# Patient Record
Sex: Female | Born: 1985 | Race: White | Hispanic: No | Marital: Single | Smoking: Current every day smoker
Health system: Southern US, Community
[De-identification: ages and names within clinical notes are randomized; demographics above are authoritative.]

## PROBLEM LIST (undated history)

## (undated) DIAGNOSIS — N200 Calculus of kidney: Secondary | ICD-10-CM

## (undated) HISTORY — PX: LITHOTRIPSY: SUR834

## (undated) HISTORY — PX: WISDOM TOOTH EXTRACTION: SHX21

---

## 2013-08-31 ENCOUNTER — Emergency Department (HOSPITAL_COMMUNITY): Payer: Medicaid Other

## 2013-08-31 ENCOUNTER — Encounter (HOSPITAL_COMMUNITY): Payer: Self-pay | Admitting: Emergency Medicine

## 2013-08-31 ENCOUNTER — Emergency Department (HOSPITAL_COMMUNITY)
Admission: EM | Admit: 2013-08-31 | Discharge: 2013-08-31 | Disposition: A | Discharge status: Home / Self Care | Payer: Medicaid Other | Attending: Emergency Medicine | Admitting: Emergency Medicine

## 2013-08-31 DIAGNOSIS — Z87442 Personal history of urinary calculi: Secondary | ICD-10-CM | POA: Diagnosis not present

## 2013-08-31 DIAGNOSIS — R109 Unspecified abdominal pain: Secondary | ICD-10-CM | POA: Diagnosis present

## 2013-08-31 DIAGNOSIS — M6281 Muscle weakness (generalized): Secondary | ICD-10-CM | POA: Diagnosis not present

## 2013-08-31 DIAGNOSIS — Z3202 Encounter for pregnancy test, result negative: Secondary | ICD-10-CM | POA: Diagnosis not present

## 2013-08-31 DIAGNOSIS — N23 Unspecified renal colic: Secondary | ICD-10-CM | POA: Diagnosis not present

## 2013-08-31 HISTORY — DX: Calculus of kidney: N20.0

## 2013-08-31 LAB — URINALYSIS, ROUTINE W REFLEX MICROSCOPIC
Glucose, UA: NEGATIVE mg/dL
Ketones, ur: NEGATIVE mg/dL
NITRITE: NEGATIVE
Protein, ur: 100 mg/dL — AB
SPECIFIC GRAVITY, URINE: 1.027 (ref 1.005–1.030)
UROBILINOGEN UA: 1 mg/dL (ref 0.0–1.0)
pH: 5.5 (ref 5.0–8.0)

## 2013-08-31 LAB — POC URINE PREG, ED: PREG TEST UR: NEGATIVE

## 2013-08-31 LAB — COMPREHENSIVE METABOLIC PANEL
ALK PHOS: 101 U/L (ref 39–117)
ALT: 39 U/L — ABNORMAL HIGH (ref 0–35)
ANION GAP: 17 — AB (ref 5–15)
AST: 24 U/L (ref 0–37)
Albumin: 4.6 g/dL (ref 3.5–5.2)
BUN: 13 mg/dL (ref 6–23)
CO2: 22 mEq/L (ref 19–32)
Calcium: 10.4 mg/dL (ref 8.4–10.5)
Chloride: 102 mEq/L (ref 96–112)
Creatinine, Ser: 0.88 mg/dL (ref 0.50–1.10)
GFR calc Af Amer: >90 mL/min (ref >90)
GFR calc non Af Amer: 87 mL/min — ABNORMAL LOW (ref >90)
Glucose, Bld: 97 mg/dL (ref 70–99)
POTASSIUM: 4.2 meq/L (ref 3.7–5.3)
SODIUM: 141 meq/L (ref 137–147)
TOTAL PROTEIN: 8.2 g/dL (ref 6.0–8.3)
Total Bilirubin: 0.3 mg/dL (ref 0.3–1.2)

## 2013-08-31 LAB — LIPASE, BLOOD: Lipase: 19 U/L (ref 11–59)

## 2013-08-31 LAB — CBC WITH DIFFERENTIAL/PLATELET
Basophils Absolute: 0.1 10*3/uL (ref 0.0–0.1)
Basophils Relative: 1 % (ref 0–1)
EOS ABS: 0.2 10*3/uL (ref 0.0–0.7)
Eosinophils Relative: 2 % (ref 0–5)
HCT: 44.6 % (ref 36.0–46.0)
Hemoglobin: 15 g/dL (ref 12.0–15.0)
Lymphocytes Relative: 23 % (ref 12–46)
Lymphs Abs: 2.4 10*3/uL (ref 0.7–4.0)
MCH: 29.6 pg (ref 26.0–34.0)
MCHC: 33.6 g/dL (ref 30.0–36.0)
MCV: 88 fL (ref 78.0–100.0)
MONOS PCT: 7 % (ref 3–12)
Monocytes Absolute: 0.8 10*3/uL (ref 0.1–1.0)
NEUTROS PCT: 67 % (ref 43–77)
Neutro Abs: 7 10*3/uL (ref 1.7–7.7)
PLATELETS: 324 10*3/uL (ref 150–400)
RBC: 5.07 MIL/uL (ref 3.87–5.11)
RDW: 14.2 % (ref 11.5–15.5)
WBC: 10.4 10*3/uL (ref 4.0–10.5)

## 2013-08-31 LAB — URINE MICROSCOPIC-ADD ON

## 2013-08-31 MED ORDER — OXYCODONE-ACETAMINOPHEN 5-325 MG PO TABS
2.0000 | ORAL_TABLET | Freq: Once | ORAL | Status: AC
  Administered 2013-08-31: 2 via ORAL
  Filled 2013-08-31: qty 2

## 2013-08-31 MED ORDER — IBUPROFEN 600 MG PO TABS: 600.0000 mg | ORAL_TABLET | Freq: Four times a day (QID) | ORAL | Status: DC | PRN

## 2013-08-31 MED ORDER — OXYCODONE-ACETAMINOPHEN 5-325 MG PO TABS: 1.0000 | ORAL_TABLET | Freq: Four times a day (QID) | ORAL | Status: DC | PRN

## 2013-08-31 MED ORDER — HYDROMORPHONE HCL PF 1 MG/ML IJ SOLN
1.0000 mg | INTRAMUSCULAR | Status: AC | PRN
  Administered 2013-08-31 (×2): 1 mg via INTRAVENOUS
  Filled 2013-08-31 (×2): qty 1

## 2013-08-31 MED ORDER — KETOROLAC TROMETHAMINE 30 MG/ML IJ SOLN
30.0000 mg | Freq: Once | INTRAMUSCULAR | Status: AC
  Administered 2013-08-31: 30 mg via INTRAVENOUS
  Filled 2013-08-31: qty 1

## 2013-08-31 MED ORDER — TAMSULOSIN HCL 0.4 MG PO CAPS: 0.4000 mg | ORAL_CAPSULE | Freq: Every day | ORAL | Status: DC

## 2013-08-31 MED ORDER — HYDROMORPHONE HCL PF 1 MG/ML IJ SOLN
1.0000 mg | Freq: Once | INTRAMUSCULAR | Status: AC
  Administered 2013-08-31: 1 mg via INTRAVENOUS
  Filled 2013-08-31: qty 1

## 2013-08-31 MED ORDER — ONDANSETRON HCL 4 MG/2ML IJ SOLN
4.0000 mg | Freq: Once | INTRAMUSCULAR | Status: AC
  Administered 2013-08-31: 4 mg via INTRAVENOUS
  Filled 2013-08-31: qty 2

## 2013-08-31 NOTE — ED Provider Notes (Signed)
CSN: [id number]     Arrival date & time 08/31/13  1435 History   First MD Initiated Contact with Patient 08/31/13 1458     Chief Complaint  Patient presents with  . Abdominal Pain     (Consider location/radiation/quality/duration/timing/severity/associated sxs/prior Treatment) HPI Comments: Patient with history of cesarean section, lithotripsy for previous kidney stones -- presents with complaint of left flank and abdominal pain that began at 12:30PM today.  The symptoms are associated with nausea and vomiting. Patient denies chest pain, shortness of breath, cough. She denies diarrhea, dysuria, hematuria. No treatments prior to arrival. She was treated previously for stones in [place]. The onset of this condition was acute. The course is constant. Aggravating factors: none. Alleviating factors: none.    Patient is a 28 y.o. female presenting with abdominal pain. The history is provided by the patient.  Abdominal Pain Associated symptoms: nausea and vomiting   Associated symptoms: no chest pain, no cough, no diarrhea, no dysuria, no fever, no hematuria, no sore throat, no vaginal bleeding and no vaginal discharge     Past Medical History  Diagnosis Date  . Kidney stones    History reviewed. No pertinent past surgical history. No family history on file. History  Substance Use Topics  . Smoking status: Not on file  . Smokeless tobacco: Not on file  . Alcohol Use: Not on file   OB History   Grav Para Term Preterm Abortions TAB SAB Ect Mult Living                 Review of Systems  Constitutional: Negative for fever.  HENT: Negative for rhinorrhea and sore throat.   Eyes: Negative for redness.  Respiratory: Negative for cough.   Cardiovascular: Negative for chest pain.  Gastrointestinal: Positive for nausea, vomiting and abdominal pain. Negative for diarrhea.  Genitourinary: Positive for flank pain. Negative for dysuria, hematuria, vaginal bleeding and vaginal discharge.   Musculoskeletal: Negative for myalgias.  Skin: Negative for rash.  Neurological: Positive for weakness. Negative for headaches.    Allergies  Review of patient's allergies indicates no known allergies.  Home Medications   Prior to Admission medications   Not on File   BP 108/81  Pulse 78  Temp(Src) 97.6 F (36.4 C) (Oral)  Resp 18  SpO2 95%  LMP 08/30/2013  Physical Exam  Nursing note and vitals reviewed. Constitutional: She appears well-developed and well-nourished. She appears distressed (tearful, rolling in bed).  HENT:  Head: Normocephalic and atraumatic.  Eyes: Conjunctivae are normal. Right eye exhibits no discharge. Left eye exhibits no discharge.  Neck: Normal range of motion. Neck supple.  Cardiovascular: Normal rate, regular rhythm and normal heart sounds.   No murmur heard. Pulmonary/Chest: Effort normal and breath sounds normal. No respiratory distress. She has no wheezes. She has no rales.  Abdominal: Soft. Bowel sounds are normal. She exhibits no distension. There is tenderness (left lateral and left lower, mild). There is no rebound and no guarding.  Neurological: She is alert.  Skin: Skin is warm and dry.  Psychiatric: She has a normal mood and affect.    ED Course  Procedures (including critical care time) Labs Review Labs Reviewed  COMPREHENSIVE METABOLIC PANEL - Abnormal; Notable for the following:    ALT 39 (*)    GFR calc non Af Amer 87 (*)    Anion gap 17 (*)    All other components within normal limits  URINALYSIS, ROUTINE W REFLEX MICROSCOPIC - Abnormal; Notable for the following:  Color, Urine RED (*)    APPearance TURBID (*)    Hgb urine dipstick LARGE (*)    Bilirubin Urine MODERATE (*)    Protein, ur 100 (*)    Leukocytes, UA SMALL (*)    All other components within normal limits  URINE MICROSCOPIC-ADD ON - Abnormal; Notable for the following:    Bacteria, UA FEW (*)    Crystals CA OXALATE CRYSTALS (*)    All other components  within normal limits  CBC WITH DIFFERENTIAL  LIPASE, BLOOD  POC URINE PREG, ED    Imaging Review Ct Renal Stone Study  08/31/2013   CLINICAL DATA:  Left flank pain.  Nausea and vomiting.  EXAM: CT RENAL STONE PROTOCOL  TECHNIQUE: Multidetector CT imaging of the abdomen and pelvis was performed following the standard protocol without intravenous contrast  COMPARISON:  None.  FINDINGS: The lung bases are clear.  No pleural or pericardial effusion.  The patient has moderate left hydronephrosis due to a 0.7 cm distal left ureteral stone. The stone is approximately 5 cm proximal to the UVJ. A 0.4 cm nonobstructing stone is identified in the midpole of the left kidney. Also seen is a punctate nonobstructing stone in the lower pole of the right kidney. The kidneys are otherwise unremarkable.  The liver is low attenuating consistent with fatty infiltration. No focal liver lesion is seen. The gallbladder, adrenal glands, spleen and pancreas appear normal. Uterus, adnexa and urinary bladder are unremarkable. The stomach, small and large bowel and appendix appear normal. No lymphadenopathy or fluid is seen. There is no focal bony abnormality.  IMPRESSION: Moderate left hydronephrosis due to a 0.7 cm distal left ureteral stone.  0.4 cm nonobstructing stone left kidney. Punctate nonobstructing stone right kidney also seen.  Fatty infiltration of the liver.   Electronically Signed   By: Drusilla Kanner M.D.   On: 08/31/2013 17:10     EKG Interpretation   Date/Time:  Saturday August 31 2013 14:49:15 EDT Ventricular Rate:  70 PR Interval:  151 QRS Duration: 90 QT Interval:  432 QTC Calculation: 466 R Axis:   66 Text Interpretation:  Age not entered, assumed to be  28 years old for  purpose of ECG interpretation Sinus rhythm Baseline wander in lead(s) V6  No old tracing to compare Confirmed by Ethelda Chick  MD, SAM [phone]) on  08/31/2013 3:52:51 PM      3:05 PM Patient seen and examined. Work-up initiated.  CT ordered -- no priors here and pain described pain as being worse than she has ever had. Medications ordered.   Vital signs reviewed and are as follows: BP 108/81  Pulse 78  Temp(Src) 97.6 F (36.4 C) (Oral)  Resp 18  SpO2 95%  LMP 08/30/2013  5:28 PM Pt d/w and seen by Dr. Ethelda Chick. Patient has 7mm distal L stone.   5:34 PM Reviewed findings with Dr. Ethelda Chick. Will give toradol.   6:06 PM Pain is better controlled. Will attempt transition to PO percocet. Pt informed of findings.   7:13 PM patient continues to do well and is requesting discharge home.  Patient counseled on kidney stone treatment. Urged patient to strain urine and save any stones. Urged urology follow-up and return to [facility] with any complications. Counseled patient to maintain good fluid intake.   Counseled patient on use of Flomax.   Patient counseled on use of narcotic pain medications. Counseled not to combine these medications with others containing tylenol. Urged not to drink alcohol, drive, or perform  any other activities that requires focus while taking these medications. The patient verbalizes understanding and agrees with the plan.   MDM   Final diagnoses:  Ureteral colic   Patient with left ureteral colic from 7 mm stone demonstrated in the distal ureter. Symptoms are controlled in emergency department patient is much more comfortable. She is appropriate for discharge to home with urology followup. Referrals given. Pain medicine and Flomax given for home. Return precautions discussed with patient.    Renne CriglerJoshua Shacola Schussler, PA-C 08/31/13 1914

## 2013-08-31 NOTE — ED Provider Notes (Signed)
Plains of left flank pain radiating to left groin onset 12:30 PM today. Pain feels like kidney stone she's had in the past. No treatment prior to coming here nothing makes symptoms better or worse. Feels somewhat improved since treatment in the emergency department on exam appears uncomfortable. Rocking back and forth on the bed. Alert Glasgow Coma Score 15  Connie SouSam Kadee Philyaw, MD 08/31/13 2211

## 2013-08-31 NOTE — ED Notes (Signed)
Bed: WU98 Expected date:  Expected time:  Means of arrival:  Comments: EMS- flank and abdominal pain

## 2013-08-31 NOTE — ED Notes (Signed)
PA at bedside.

## 2013-08-31 NOTE — Discharge Instructions (Signed)
Please read and follow all provided instructions.  Your diagnoses today include:  1. Ureteral colic     Tests performed today include:  Urine test that showed blood in your urine and no infection  CT scan which showed a 7 millimeter kidney on the left side  Blood test that showed normal kidney function  Vital signs. See below for your results today.   Medications prescribed:   Percocet (oxycodone/acetaminophen) - narcotic pain medication  DO NOT drive or perform any activities that require you to be awake and alert because this medicine can make you drowsy. BE VERY CAREFUL not to take multiple medicines containing Tylenol (also called acetaminophen). Doing so can lead to an overdose which can damage your liver and cause liver failure and possibly death.   Flomax (tamsulosin) - relaxes smooth muscle to help kidney stones pass   Ibuprofen (Motrin, Advil) - anti-inflammatory pain medication  Do not exceed 600mg  ibuprofen every 6 hours, take with food  You have been prescribed an anti-inflammatory medication or NSAID. Take with food. Take smallest effective dose for the shortest duration needed for your pain. Stop taking if you experience stomach pain or vomiting.   Take any prescribed medications only as directed.  Home care instructions:  Follow any educational materials contained in this packet.  Please double your fluid intake for the next several days. Strain your urine and save any stones that may pass.   Follow-up instructions: Please follow-up with your urologist or the urologist referral (provided on front page) in the next 1 week for further evaluation of your symptoms.  If you need to return to the Emergency Department, go to [facility] and not [facility]. The urologists are located at [facility] and can better care for you at this location.  Return instructions:  If you need to return to the Emergency Department, go to [facility] and  not [facility]. The [facility]rologists are located at [facility] and can better care for you at this location.   Please return to the Emergency Department if you experience worsening symptoms.  Please return if you develop fever or uncontrolled pain or vomiting.  Please return if you have any other emergent concerns.  Additional Information:  Your vital signs today were: BP 95/64   Pulse 74   Temp(Src) 97.6 F (36.4 C) (Oral)   Resp 18   SpO2 99%   LMP 08/30/2013 If your blood pressure (BP) was elevated above 135/85 this visit, please have this repeated by your doctor within one month. --------------

## 2013-08-31 NOTE — ED Notes (Signed)
Pt sts LMP started Thursday.  Was wearing a large tampon today and thought that might have been why the pain started today.  Pain rad. from back around to Left femoral area.  Pt concerned about pregnancy.  Pt reports feels like past kidney stones. No trouble urinating but sts has been urinating more frequently than usual.

## 2013-08-31 NOTE — ED Notes (Signed)
Patient with left flank pain that radiates around to her abdominal area.  Denies pain with urination, is having nausea and vomiting.  History of kidney stones.

## 2013-08-31 NOTE — ED Provider Notes (Signed)
Medical screening examination/treatment/procedure(s) were conducted as a shared visit with non-physician practitioner(s) and myself.  I personally evaluated the patient during the encounter.   EKG Interpretation   Date/Time:  Saturday August 31 2013 14:49:15 EDT Ventricular Rate:  70 PR Interval:  151 QRS Duration: 90 QT Interval:  432 QTC Calculation: 466 R Axis:   66 Text Interpretation:  Age not entered, assumed to be  28 years old for  purpose of ECG interpretation Sinus rhythm Baseline wander in lead(s) V6  No old tracing to compare Confirmed by Ethelda ChickJACUBOWITZ  MD, Alese Furniss [phone]) on  08/31/2013 3:52:51 PM       Doug SouSam Malillany Kazlauskas, MD 08/31/13 2212

## 2013-08-31 NOTE — ED Notes (Signed)
Patient will give urine sample when she starts to feel pain medication.

## 2013-09-02 ENCOUNTER — Observation Stay (HOSPITAL_COMMUNITY)
Admission: EM | Admit: 2013-09-02 | Discharge: 2013-09-03 | Disposition: A | Discharge status: Home / Self Care | Payer: Medicaid Other | Attending: Urology | Admitting: Urology

## 2013-09-02 ENCOUNTER — Inpatient Hospital Stay (HOSPITAL_COMMUNITY): Payer: Medicaid Other | Admitting: Certified Registered Nurse Anesthetist

## 2013-09-02 ENCOUNTER — Other Ambulatory Visit: Payer: Self-pay | Admitting: Urology

## 2013-09-02 ENCOUNTER — Emergency Department (HOSPITAL_COMMUNITY): Payer: Medicaid Other

## 2013-09-02 ENCOUNTER — Encounter (HOSPITAL_COMMUNITY): Payer: Medicaid Other | Admitting: Certified Registered Nurse Anesthetist

## 2013-09-02 ENCOUNTER — Encounter (HOSPITAL_COMMUNITY)
Admission: EM | Disposition: A | Discharge status: Home / Self Care | Payer: Self-pay | Source: Home / Self Care | Attending: Urology

## 2013-09-02 ENCOUNTER — Encounter (HOSPITAL_COMMUNITY): Payer: Self-pay | Admitting: Emergency Medicine

## 2013-09-02 DIAGNOSIS — Z79899 Other long term (current) drug therapy: Secondary | ICD-10-CM | POA: Insufficient documentation

## 2013-09-02 DIAGNOSIS — N201 Calculus of ureter: Secondary | ICD-10-CM | POA: Diagnosis not present

## 2013-09-02 DIAGNOSIS — N200 Calculus of kidney: Secondary | ICD-10-CM | POA: Diagnosis present

## 2013-09-02 DIAGNOSIS — F17200 Nicotine dependence, unspecified, uncomplicated: Secondary | ICD-10-CM | POA: Insufficient documentation

## 2013-09-02 HISTORY — PX: CYSTOSCOPY/RETROGRADE/URETEROSCOPY: SHX5316

## 2013-09-02 LAB — COMPREHENSIVE METABOLIC PANEL
ALK PHOS: 87 U/L (ref 39–117)
ALT: 27 U/L (ref 0–35)
ANION GAP: 10 (ref 5–15)
AST: 18 U/L (ref 0–37)
Albumin: 4 g/dL (ref 3.5–5.2)
BUN: 13 mg/dL (ref 6–23)
CO2: 28 mEq/L (ref 19–32)
Calcium: 8.8 mg/dL (ref 8.4–10.5)
Chloride: 103 mEq/L (ref 96–112)
Creatinine, Ser: 0.89 mg/dL (ref 0.50–1.10)
GFR calc non Af Amer: 86 mL/min — ABNORMAL LOW (ref >90)
GLUCOSE: 85 mg/dL (ref 70–99)
POTASSIUM: 4 meq/L (ref 3.7–5.3)
Sodium: 141 mEq/L (ref 137–147)
Total Bilirubin: 0.4 mg/dL (ref 0.3–1.2)
Total Protein: 7 g/dL (ref 6.0–8.3)

## 2013-09-02 LAB — BASIC METABOLIC PANEL
Anion gap: 13 (ref 5–15)
BUN: 16 mg/dL (ref 6–23)
CALCIUM: 8.7 mg/dL (ref 8.4–10.5)
CHLORIDE: 103 meq/L (ref 96–112)
CO2: 24 meq/L (ref 19–32)
CREATININE: 0.95 mg/dL (ref 0.50–1.10)
GFR calc Af Amer: >90 mL/min (ref >90)
GFR calc non Af Amer: 80 mL/min — ABNORMAL LOW (ref >90)
GLUCOSE: 96 mg/dL (ref 70–99)
Potassium: 4 mEq/L (ref 3.7–5.3)
Sodium: 140 mEq/L (ref 137–147)

## 2013-09-02 LAB — URINALYSIS, ROUTINE W REFLEX MICROSCOPIC
BILIRUBIN URINE: NEGATIVE
Bilirubin Urine: NEGATIVE
GLUCOSE, UA: NEGATIVE mg/dL
Glucose, UA: NEGATIVE mg/dL
Ketones, ur: NEGATIVE mg/dL
Ketones, ur: NEGATIVE mg/dL
NITRITE: NEGATIVE
Nitrite: NEGATIVE
Protein, ur: NEGATIVE mg/dL
Protein, ur: NEGATIVE mg/dL
SPECIFIC GRAVITY, URINE: 1.017 (ref 1.005–1.030)
Specific Gravity, Urine: 1.016 (ref 1.005–1.030)
UROBILINOGEN UA: 0.2 mg/dL (ref 0.0–1.0)
Urobilinogen, UA: 0.2 mg/dL (ref 0.0–1.0)
pH: 5.5 (ref 5.0–8.0)
pH: 5.5 (ref 5.0–8.0)

## 2013-09-02 LAB — URINE MICROSCOPIC-ADD ON

## 2013-09-02 LAB — PREGNANCY, URINE: PREG TEST UR: NEGATIVE

## 2013-09-02 LAB — CBC WITH DIFFERENTIAL/PLATELET
Basophils Absolute: 0 10*3/uL (ref 0.0–0.1)
Basophils Relative: 1 % (ref 0–1)
EOS PCT: 5 % (ref 0–5)
Eosinophils Absolute: 0.4 10*3/uL (ref 0.0–0.7)
HEMATOCRIT: 39.4 % (ref 36.0–46.0)
Hemoglobin: 13 g/dL (ref 12.0–15.0)
LYMPHS PCT: 15 % (ref 12–46)
Lymphs Abs: 1.2 10*3/uL (ref 0.7–4.0)
MCH: 29.4 pg (ref 26.0–34.0)
MCHC: 33 g/dL (ref 30.0–36.0)
MCV: 89.1 fL (ref 78.0–100.0)
MONO ABS: 0.7 10*3/uL (ref 0.1–1.0)
Monocytes Relative: 8 % (ref 3–12)
NEUTROS ABS: 6 10*3/uL (ref 1.7–7.7)
Neutrophils Relative %: 73 % (ref 43–77)
Platelets: 272 10*3/uL (ref 150–400)
RBC: 4.42 MIL/uL (ref 3.87–5.11)
RDW: 14.3 % (ref 11.5–15.5)
WBC: 8.3 10*3/uL (ref 4.0–10.5)

## 2013-09-02 LAB — CBC
HEMATOCRIT: 40.2 % (ref 36.0–46.0)
HEMOGLOBIN: 12.8 g/dL (ref 12.0–15.0)
MCH: 29.2 pg (ref 26.0–34.0)
MCHC: 31.8 g/dL (ref 30.0–36.0)
MCV: 91.6 fL (ref 78.0–100.0)
Platelets: 266 10*3/uL (ref 150–400)
RBC: 4.39 MIL/uL (ref 3.87–5.11)
RDW: 14.4 % (ref 11.5–15.5)
WBC: 8.8 10*3/uL (ref 4.0–10.5)

## 2013-09-02 LAB — MRSA PCR SCREENING: MRSA BY PCR: NEGATIVE

## 2013-09-02 SURGERY — CYSTOSCOPY/RETROGRADE/URETEROSCOPY
Anesthesia: General | Site: Ureter | Laterality: Left

## 2013-09-02 MED ORDER — MIDAZOLAM HCL 5 MG/5ML IJ SOLN
INTRAMUSCULAR | Status: DC | PRN
  Administered 2013-09-02: 1 mg via INTRAVENOUS

## 2013-09-02 MED ORDER — IOHEXOL 300 MG/ML  SOLN
INTRAMUSCULAR | Status: DC | PRN
  Administered 2013-09-02: 8 mL via INTRAVENOUS

## 2013-09-02 MED ORDER — KETOROLAC TROMETHAMINE 30 MG/ML IJ SOLN
INTRAMUSCULAR | Status: DC | PRN
  Administered 2013-09-02: 30 mg via INTRAVENOUS

## 2013-09-02 MED ORDER — SENNOSIDES-DOCUSATE SODIUM 8.6-50 MG PO TABS
2.0000 | ORAL_TABLET | Freq: Every day | ORAL | Status: DC
  Administered 2013-09-02: 2 via ORAL
  Filled 2013-09-02 (×2): qty 2

## 2013-09-02 MED ORDER — FENTANYL CITRATE 0.05 MG/ML IJ SOLN
INTRAMUSCULAR | Status: AC
  Filled 2013-09-02: qty 2

## 2013-09-02 MED ORDER — PROPOFOL 10 MG/ML IV BOLUS
INTRAVENOUS | Status: DC | PRN
  Administered 2013-09-02: 200 mg via INTRAVENOUS

## 2013-09-02 MED ORDER — OXYCODONE-ACETAMINOPHEN 5-325 MG PO TABS
1.0000 | ORAL_TABLET | ORAL | Status: DC | PRN
  Administered 2013-09-02 – 2013-09-03 (×2): 2 via ORAL
  Filled 2013-09-02 (×2): qty 2

## 2013-09-02 MED ORDER — DEXTROSE 5 % IV SOLN
3.0000 g | INTRAVENOUS | Status: AC
  Administered 2013-09-02: 3 g via INTRAVENOUS
  Filled 2013-09-02 (×2): qty 3000

## 2013-09-02 MED ORDER — SODIUM CHLORIDE 0.45 % IV SOLN
INTRAVENOUS | Status: DC
  Administered 2013-09-02: 22:00:00 via INTRAVENOUS

## 2013-09-02 MED ORDER — HYDROMORPHONE HCL PF 1 MG/ML IJ SOLN: 0.5000 mg | INTRAMUSCULAR | Status: DC | PRN

## 2013-09-02 MED ORDER — MEPERIDINE HCL 25 MG/ML IJ SOLN: 6.2500 mg | INTRAMUSCULAR | Status: DC | PRN

## 2013-09-02 MED ORDER — DEXAMETHASONE SODIUM PHOSPHATE 10 MG/ML IJ SOLN
INTRAMUSCULAR | Status: AC
  Filled 2013-09-02: qty 1

## 2013-09-02 MED ORDER — DEXAMETHASONE SODIUM PHOSPHATE 10 MG/ML IJ SOLN
INTRAMUSCULAR | Status: DC | PRN
  Administered 2013-09-02: 10 mg via INTRAVENOUS

## 2013-09-02 MED ORDER — LACTATED RINGERS IV SOLN: INTRAVENOUS | Status: DC

## 2013-09-02 MED ORDER — LACTATED RINGERS IV SOLN
INTRAVENOUS | Status: DC | PRN
  Administered 2013-09-02 (×2): via INTRAVENOUS

## 2013-09-02 MED ORDER — LIDOCAINE HCL (CARDIAC) 20 MG/ML IV SOLN
INTRAVENOUS | Status: AC
  Filled 2013-09-02: qty 5

## 2013-09-02 MED ORDER — FENTANYL CITRATE 0.05 MG/ML IJ SOLN: 25.0000 ug | INTRAMUSCULAR | Status: DC | PRN

## 2013-09-02 MED ORDER — DIPHENHYDRAMINE HCL 12.5 MG/5ML PO ELIX: 12.5000 mg | ORAL_SOLUTION | Freq: Four times a day (QID) | ORAL | Status: DC | PRN

## 2013-09-02 MED ORDER — DIPHENHYDRAMINE HCL 50 MG/ML IJ SOLN: 12.5000 mg | Freq: Four times a day (QID) | INTRAMUSCULAR | Status: DC | PRN

## 2013-09-02 MED ORDER — ONDANSETRON HCL 4 MG/2ML IJ SOLN
4.0000 mg | Freq: Once | INTRAMUSCULAR | Status: AC
  Administered 2013-09-02: 4 mg via INTRAVENOUS
  Filled 2013-09-02: qty 2

## 2013-09-02 MED ORDER — SODIUM CHLORIDE 0.9 % IV BOLUS (SEPSIS)
1000.0000 mL | Freq: Once | INTRAVENOUS | Status: AC
  Administered 2013-09-02: 1000 mL via INTRAVENOUS

## 2013-09-02 MED ORDER — FENTANYL CITRATE 0.05 MG/ML IJ SOLN
INTRAMUSCULAR | Status: DC | PRN
  Administered 2013-09-02 (×3): 50 ug via INTRAVENOUS

## 2013-09-02 MED ORDER — PROMETHAZINE HCL 25 MG/ML IJ SOLN: 6.2500 mg | INTRAMUSCULAR | Status: DC | PRN

## 2013-09-02 MED ORDER — SODIUM CHLORIDE 0.9 % IV SOLN
Freq: Once | INTRAVENOUS | Status: AC
  Administered 2013-09-02: 11:00:00 via INTRAVENOUS

## 2013-09-02 MED ORDER — MIDAZOLAM HCL 2 MG/2ML IJ SOLN
INTRAMUSCULAR | Status: AC
  Filled 2013-09-02: qty 2

## 2013-09-02 MED ORDER — PROPOFOL 10 MG/ML IV BOLUS
INTRAVENOUS | Status: AC
  Filled 2013-09-02: qty 20

## 2013-09-02 MED ORDER — HYDROMORPHONE HCL PF 1 MG/ML IJ SOLN
1.0000 mg | INTRAMUSCULAR | Status: AC | PRN
  Administered 2013-09-02 (×2): 1 mg via INTRAVENOUS
  Filled 2013-09-02 (×2): qty 1

## 2013-09-02 MED ORDER — LIDOCAINE HCL (CARDIAC) 20 MG/ML IV SOLN
INTRAVENOUS | Status: DC | PRN
  Administered 2013-09-02: 75 mg via INTRAVENOUS

## 2013-09-02 MED ORDER — ONDANSETRON HCL 4 MG/2ML IJ SOLN: 4.0000 mg | INTRAMUSCULAR | Status: DC | PRN

## 2013-09-02 MED ORDER — ACETAMINOPHEN 500 MG PO TABS: 1000.0000 mg | ORAL_TABLET | ORAL | Status: DC

## 2013-09-02 MED ORDER — METOCLOPRAMIDE HCL 5 MG/ML IJ SOLN
INTRAMUSCULAR | Status: DC | PRN
  Administered 2013-09-02: 10 mg via INTRAVENOUS

## 2013-09-02 MED ORDER — ONDANSETRON HCL 4 MG/2ML IJ SOLN
INTRAMUSCULAR | Status: AC
  Filled 2013-09-02: qty 2

## 2013-09-02 MED ORDER — HYDROMORPHONE HCL PF 1 MG/ML IJ SOLN
0.5000 mg | Freq: Once | INTRAMUSCULAR | Status: DC
  Filled 2013-09-02: qty 1

## 2013-09-02 MED ORDER — OXYBUTYNIN CHLORIDE 5 MG PO TABS
5.0000 mg | ORAL_TABLET | Freq: Three times a day (TID) | ORAL | Status: DC | PRN
  Administered 2013-09-03: 5 mg via ORAL
  Filled 2013-09-02 (×2): qty 1

## 2013-09-02 MED ORDER — KETOROLAC TROMETHAMINE 30 MG/ML IJ SOLN
INTRAMUSCULAR | Status: AC
  Filled 2013-09-02: qty 1

## 2013-09-02 MED ORDER — ZOLPIDEM TARTRATE 5 MG PO TABS
5.0000 mg | ORAL_TABLET | Freq: Every evening | ORAL | Status: DC | PRN
  Administered 2013-09-03: 5 mg via ORAL
  Filled 2013-09-02: qty 1

## 2013-09-02 MED ORDER — ONDANSETRON HCL 4 MG/2ML IJ SOLN
INTRAMUSCULAR | Status: DC | PRN
  Administered 2013-09-02 (×2): 2 mg via INTRAVENOUS

## 2013-09-02 SURGICAL SUPPLY — 16 items
BAG URO CATCHER STRL LF (DRAPE) ×3 IMPLANT
BASKET ZERO TIP NITINOL 2.4FR (BASKET) ×3 IMPLANT
CATH CLEAR GEL 3F BACKSTOP (CATHETERS) ×3 IMPLANT
CATH INTERMIT  6FR 70CM (CATHETERS) ×3 IMPLANT
DRAPE CAMERA CLOSED 9X96 (DRAPES) ×3 IMPLANT
FIBER LASER FLEXIVA 365 (UROLOGICAL SUPPLIES) ×3 IMPLANT
GLOVE BIOGEL M STRL SZ7.5 (GLOVE) ×3 IMPLANT
GOWN STRL REUS W/TWL LRG LVL3 (GOWN DISPOSABLE) ×3 IMPLANT
GOWN STRL REUS W/TWL XL LVL3 (GOWN DISPOSABLE) ×3 IMPLANT
GUIDEWIRE STR DUAL SENSOR (WIRE) ×3 IMPLANT
MANIFOLD NEPTUNE II (INSTRUMENTS) ×3 IMPLANT
PACK CYSTO (CUSTOM PROCEDURE TRAY) ×3 IMPLANT
SCRUB PCMX 4 OZ (MISCELLANEOUS) ×3 IMPLANT
STENT URET 6FRX24 CONTOUR (STENTS) ×3 IMPLANT
TUBING CONNECTING 10 (TUBING) ×2 IMPLANT
TUBING CONNECTING 10' (TUBING) ×1

## 2013-09-02 NOTE — ED Notes (Signed)
Bed: [id number] Expected date:  Expected time:  Means of arrival:  Comments: EMS-kidney stone

## 2013-09-02 NOTE — ED Provider Notes (Addendum)
CSN: [id number]     Arrival date & time 09/02/13  0901 History   First MD Initiated Contact with Patient 09/02/13 [phone]     Chief Complaint  Patient presents with  . Nephrolithiasis      HPI  Patient seen and evaluated 814 in this emergency room. Diagnosed with a distal 7 mm left ureteral stone. Did well the remainder the day Saturday, and yesterday, Sunday. Awakens morning at 5 AM with sudden recurrence of severe colicky left low back, and left lower  abdominal pain. Take ibuprofen, Percocet, and Flomax but has vomited several times. Presents here with marked recurrence of pain. Previous ureteral stones. Past one most recently 1 year ago. 2 years ago had one extracted in [place].  Past Medical History  Diagnosis Date  . Kidney stones    History reviewed. No pertinent past surgical history. History reviewed. No pertinent family history. History  Substance Use Topics  . Smoking status: Current Every Day Smoker -- 0.50 packs/day    Types: Cigarettes  . Smokeless tobacco: Not on file  . Alcohol Use: No   OB History   Grav Para Term Preterm Abortions TAB SAB Ect Mult Living                 Review of Systems  Constitutional: Negative for fever, chills, diaphoresis, appetite change and fatigue.  HENT: Negative for mouth sores, sore throat and trouble swallowing.   Eyes: Negative for visual disturbance.  Respiratory: Negative for cough, chest tightness, shortness of breath and wheezing.   Cardiovascular: Negative for chest pain.  Gastrointestinal: Positive for nausea and vomiting. Negative for abdominal pain, diarrhea and abdominal distention.  Endocrine: Negative for polydipsia, polyphagia and polyuria.  Genitourinary: Positive for flank pain. Negative for dysuria, frequency and hematuria.  Musculoskeletal: Negative for gait problem.  Skin: Negative for color change, pallor and rash.  Neurological: Negative for dizziness, syncope, light-headedness and headaches.  Hematological:  Does not bruise/bleed easily.  Psychiatric/Behavioral: Negative for behavioral problems and confusion.      Allergies  Review of patient's allergies indicates no known allergies.  Home Medications   Prior to Admission medications   Medication Sig Start Date End Date Taking? Authorizing Provider  ibuprofen (ADVIL,MOTRIN) 600 MG tablet Take 1 tablet (600 mg total) by mouth every 6 (six) hours as needed. 08/31/13  Yes Renne CriglerJoshua Geiple, PA-C  oxyCODONE-acetaminophen (PERCOCET/ROXICET) 5-325 MG per tablet Take 1-2 tablets by mouth every 6 (six) hours as needed for severe pain. 08/31/13  Yes Renne CriglerJoshua Geiple, PA-C  tamsulosin (FLOMAX) 0.4 MG CAPS capsule Take 1 capsule (0.4 mg total) by mouth daily. 08/31/13  Yes Joshua Geiple, PA-C   BP 133/101  Pulse 72  Temp(Src) 98.2 F (36.8 C) (Oral)  Resp 18  SpO2 97%  LMP 08/30/2013 Physical Exam  Constitutional: She is oriented to person, place, and time. She appears well-developed and well-nourished. No distress.  Appears uncomfortable. Rocking back-and-forth. Holding an emesis bag.  HENT:  Head: Normocephalic.  Eyes: Conjunctivae are normal. Pupils are equal, round, and reactive to light. No scleral icterus.  Neck: Normal range of motion. Neck supple. No thyromegaly present.  Cardiovascular: Normal rate and regular rhythm.  Exam reveals no gallop and no friction rub.   No murmur heard. Pulmonary/Chest: Effort normal and breath sounds normal. No respiratory distress. She has no wheezes. She has no rales.  Abdominal: Soft. Bowel sounds are normal. She exhibits no distension. There is no tenderness. There is no rebound.  Complains of pain in  her abdomen is nontender to examine.  Musculoskeletal: Normal range of motion.  Neurological: She is alert and oriented to person, place, and time.  Skin: Skin is warm and dry. No rash noted.  Psychiatric: She has a normal mood and affect. Her behavior is normal.    ED Course  Procedures (including critical  care time) Labs Review Labs Reviewed  BASIC METABOLIC PANEL - Abnormal; Notable for the following:    GFR calc non Af Amer 80 (*)    All other components within normal limits  CBC WITH DIFFERENTIAL  URINALYSIS, ROUTINE W REFLEX MICROSCOPIC    Imaging Review Dg Abd 1 View  09/02/2013   CLINICAL DATA:  Nephrocalcinosis  EXAM: ABDOMEN - 1 VIEW  COMPARISON:  CT abdomen and pelvis August 31, 2013  FINDINGS: There is a focal calcification in the left pelvis measuring 9 by 6 mm suspicious for calculus. A second calcification more distally in the left pelvis measures 5 x 4 mm, possibly a second ureteral calculus. No other abnormal calcifications are identified. Note that there is extensive stool obscuring the kidneys. Bowel gas pattern is unremarkable. No obstruction or free air is seen on this supine examination.  IMPRESSION: Suspect persistent distal left ureteral calculi. Bowel gas pattern unremarkable. Diffuse stool throughout colon. Note that stool obscures the kidneys and precludes evaluation for potential intrarenal calculi.   Electronically Signed   By: Bretta Bang M.D.   On: 09/02/2013 10:15   Ct Renal Stone Study  08/31/2013   CLINICAL DATA:  Left flank pain.  Nausea and vomiting.  EXAM: CT RENAL STONE PROTOCOL  TECHNIQUE: Multidetector CT imaging of the abdomen and pelvis was performed following the standard protocol without intravenous contrast  COMPARISON:  None.  FINDINGS: The lung bases are clear.  No pleural or pericardial effusion.  The patient has moderate left hydronephrosis due to a 0.7 cm distal left ureteral stone. The stone is approximately 5 cm proximal to the UVJ. A 0.4 cm nonobstructing stone is identified in the midpole of the left kidney. Also seen is a punctate nonobstructing stone in the lower pole of the right kidney. The kidneys are otherwise unremarkable.  The liver is low attenuating consistent with fatty infiltration. No focal liver lesion is seen. The gallbladder,  adrenal glands, spleen and pancreas appear normal. Uterus, adnexa and urinary bladder are unremarkable. The stomach, small and large bowel and appendix appear normal. No lymphadenopathy or fluid is seen. There is no focal bony abnormality.  IMPRESSION: Moderate left hydronephrosis due to a 0.7 cm distal left ureteral stone.  0.4 cm nonobstructing stone left kidney. Punctate nonobstructing stone right kidney also seen.  Fatty infiltration of the liver.   Electronically Signed   By: Drusilla Kanner M.D.   On: 08/31/2013 17:10     EKG Interpretation None      MDM   Final diagnoses:  Kidney stone    Plan IV was placed by paramedics. Plan symptom control. We'll obtain a plain KUB to see if there's been migration of the location of the stone. Recheck urine for infection. Check renal function. Then discussion regarding continued outpatient treatment versus extraction.  13:00:  Patient discussed with Dr. Patsi Sears of urology. Plan will be admission for extraction. No sign of acute renal injury. No sign of infection. Symptoms improved. However, her symptoms were uncontrolled with IV pain medication.    Rolland Porter, MD 09/02/13 1610  Rolland Porter, MD 09/02/13 1311

## 2013-09-02 NOTE — ED Notes (Signed)
Per [name], EMT- Lab called and said patient urine specimen was not enough and that we need more. Patient has confirmed kidney stone. Will ask patient to give another specimen when able. Dr Fayrene FearingJames is aware of the situation

## 2013-09-02 NOTE — ED Notes (Signed)
Per EMS, pt diagnosed with 7 mm kidney stone diagnosed here Saturday.  Pt pain became worse this morning.  Pain in left flank.  IV 20 g left hand.  Vitals:  146/88, hr 78, resp 18,

## 2013-09-02 NOTE — Anesthesia Preprocedure Evaluation (Signed)
Anesthesia Evaluation  Patient identified by MRN, date of birth, ID band Patient awake    Reviewed: Allergy & Precautions, H&P , NPO status , Patient's Chart, lab work & pertinent test results  Airway Mallampati: II TM Distance: >3 FB Neck ROM: Full    Dental no notable dental hx.    Pulmonary Current Smoker,  breath sounds clear to auscultation  Pulmonary exam normal       Cardiovascular negative cardio ROS  Rhythm:Regular Rate:Normal     Neuro/Psych negative neurological ROS  negative psych ROS   GI/Hepatic negative GI ROS, Neg liver ROS,   Endo/Other  negative endocrine ROS  Renal/GU negative Renal ROS  negative genitourinary   Musculoskeletal negative musculoskeletal ROS (+)   Abdominal   Peds negative pediatric ROS (+)  Hematology negative hematology ROS (+)   Anesthesia Other Findings   Reproductive/Obstetrics negative OB ROS                           Anesthesia Physical Anesthesia Plan  ASA: II  Anesthesia Plan: General   Post-op Pain Management:    Induction: Intravenous  Airway Management Planned: LMA  Additional Equipment:   Intra-op Plan:   Post-operative Plan: Extubation in OR  Informed Consent: I have reviewed the patients History and Physical, chart, labs and discussed the procedure including the risks, benefits and alternatives for the proposed anesthesia with the patient or authorized representative who has indicated his/her understanding and acceptance.   Dental advisory given  Plan Discussed with: CRNA  Anesthesia Plan Comments:         Anesthesia Quick Evaluation  

## 2013-09-02 NOTE — H&P (Signed)
Urology Admission H&P  Chief Complaint: " L side pain"  History of Present Illness:   Patient seen and evaluated 814 in this emergency room.  She is a 62- yo female, diagnosed with a distal 7 mm left ureteral stone on 08/31/13. Did well the remainder the day Saturday, and yesterday, Sunday. Awakens morning at 5 AM with sudden recurrence of severe colicky left low back, and left lower abdominal pain. Take ibuprofen, Percocet, and Flomax but has vomited several times. Presents here with marked recurrence of pain. Previous ureteral stones. Passed one spontaneously, but lost the stone, most recently 1 year ago. 2 years ago underwent lithotripsy in [place], and did not save the fragments for evaluation. She ha a hx of 2L of Mt. [employer]/day in the past. .    Past Medical History  Diagnosis Date  . Kidney stones    History reviewed. No pertinent past surgical history.  Home Medications:   (Not in a hospital admission) Allergies: No Known Allergies  History reviewed. No pertinent family history. Social History:  reports that she has been smoking Cigarettes.  She has been smoking about 0.50 packs per day. She does not have any smokeless tobacco history on file. She reports that she does not drink alcohol or use illicit drugs.  Review of Systems  Constitutional: Positive for diaphoresis. Negative for fever, chills, weight loss and malaise/fatigue.  HENT: Negative.   Eyes: Negative.   Respiratory: Negative.   Cardiovascular: Negative.   Gastrointestinal: Positive for abdominal pain.  Genitourinary: Positive for flank pain. Negative for dysuria, urgency, frequency and hematuria.  Musculoskeletal: Negative.   Skin: Negative.   Neurological: Positive for weakness. Negative for dizziness, tingling, tremors, sensory change, speech change, focal weakness, seizures and loss of consciousness.  Endo/Heme/Allergies: Negative.   Psychiatric/Behavioral: Negative.     Physical Exam:  Vital signs in last 24  hours: Temp:  [98.2 F (36.8 C)] 98.2 F (36.8 C) (08/17 0902) Pulse Rate:  [72] 72 (08/17 0902) Resp:  [18] 18 (08/17 0902) BP: (133)/(101) 133/101 mmHg (08/17 0902) SpO2:  [97 %] 97 % (08/17 0902) Physical Exam  Constitutional: She is oriented to person, place, and time. She appears well-developed and well-nourished. No distress.  HENT:  Head: Normocephalic.  Eyes: Pupils are equal, round, and reactive to light.  Neck: Normal range of motion.  Cardiovascular: Normal rate and intact distal pulses.   No murmur heard. Respiratory: Effort normal. No respiratory distress. She has no wheezes.  GI: Soft. Bowel sounds are normal. She exhibits no distension and no mass. There is tenderness. There is guarding. There is no rebound.    Genitourinary: Vagina normal.  Musculoskeletal: Normal range of motion.  Lymphadenopathy:    She has no cervical adenopathy.  Neurological: She is alert and oriented to person, place, and time.  Skin: Skin is warm and dry. She is not diaphoretic.  Psychiatric: She has a normal mood and affect. Her behavior is normal. Judgment and thought content normal.    Laboratory Data:  Results for orders placed during the hospital encounter of 09/02/13 (from the past 24 hour(s))  CBC WITH DIFFERENTIAL     Status: None   Collection Time    09/02/13  9:15 AM      Result Value Ref Range   WBC 8.3  4.0 - 10.5 K/uL   RBC 4.42  3.87 - 5.11 MIL/uL   Hemoglobin 13.0  12.0 - 15.0 g/dL   HCT 69.6  29.5 - 28.4 %   MCV  89.1  78.0 - 100.0 fL   MCH 29.4  26.0 - 34.0 pg   MCHC 33.0  30.0 - 36.0 g/dL   RDW 16.114.3  09.611.5 - 04.515.5 %   Platelets 272  150 - 400 K/uL   Neutrophils Relative % 73  43 - 77 %   Neutro Abs 6.0  1.7 - 7.7 K/uL   Lymphocytes Relative 15  12 - 46 %   Lymphs Abs 1.2  0.7 - 4.0 K/uL   Monocytes Relative 8  3 - 12 %   Monocytes Absolute 0.7  0.1 - 1.0 K/uL   Eosinophils Relative 5  0 - 5 %   Eosinophils Absolute 0.4  0.0 - 0.7 K/uL   Basophils Relative 1  0  - 1 %   Basophils Absolute 0.0  0.0 - 0.1 K/uL  BASIC METABOLIC PANEL     Status: Abnormal   Collection Time    09/02/13  9:15 AM      Result Value Ref Range   Sodium 140  137 - 147 mEq/L   Potassium 4.0  3.7 - 5.3 mEq/L   Chloride 103  96 - 112 mEq/L   CO2 24  19 - 32 mEq/L   Glucose, Bld 96  70 - 99 mg/dL   BUN 16  6 - 23 mg/dL   Creatinine, Ser 4.090.95  0.50 - 1.10 mg/dL   Calcium 8.7  8.4 - 81.110.5 mg/dL   GFR calc non Af Amer 80 (*) >90 mL/min   GFR calc Af Amer >90  >90 mL/min   Anion gap 13  5 - 15   No results found for this or any previous visit (from the past 240 hour(s)). Creatinine:  Recent Labs  08/31/13 1504 09/02/13 0915  CREATININE 0.88 0.95   Baseline Creatinine:  CLINICAL DATA: Left flank pain. Nausea and vomiting.  EXAM:  CT RENAL STONE PROTOCOL  TECHNIQUE:  Multidetector CT imaging of the abdomen and pelvis was performed  following the standard protocol without intravenous contrast  COMPARISON: None.  FINDINGS:  The lung bases are clear. No pleural or pericardial effusion.  The patient has moderate left hydronephrosis due to a 0.7 cm distal  left ureteral stone. The stone is approximately 5 cm proximal to the  UVJ. A 0.4 cm nonobstructing stone is identified in the midpole of  the left kidney. Also seen is a punctate nonobstructing stone in the  lower pole of the right kidney. The kidneys are otherwise  unremarkable.  The liver is low attenuating consistent with fatty infiltration. No  focal liver lesion is seen. The gallbladder, adrenal glands, spleen  and pancreas appear normal. Uterus, adnexa and urinary bladder are  unremarkable. The stomach, small and large bowel and appendix appear  normal. No lymphadenopathy or fluid is seen. There is no focal bony  abnormality.  IMPRESSION:  Moderate left hydronephrosis due to a 0.7 cm distal left ureteral  stone.  0.4 cm nonobstructing stone left kidney. Punctate nonobstructing  stone right kidney also seen.   Fatty infiltration of the liver.  Electronically Signed  By: Drusilla Kannerhomas Dalessio M.D.  On: 08/31/2013 17:10   Impression/Assessment:   LL ureteral calculus,. Probable ca++ox--. She remains with LLQ and L flank colit today, and desires to have ureteroscopy and stone extraction .   Plan:  Emergency ureteroscopy and stone extraction tonight.   Jethro BolusANNENBAUM, Kennedey Digilio I 09/02/2013, 12:37 PM

## 2013-09-02 NOTE — Interval H&P Note (Signed)
History and Physical Interval Note:  09/02/2013 6:38 PM  Connie ReichmannCarrie Lynn Flicker  has presented today for surgery, with the diagnosis of left lower ureteral stone  The various methods of treatment have been discussed with the patient and family. After consideration of risks, benefits and other options for treatment, the patient has consented to  Procedure(s): CYSTOSCOPY/RETROGRADE/LEFT URETEROSCOPY WITH BACKSTOP, BASKET EXTRACTION AND HOLMIUM LASER AND PlACEMENT OF LEFT STENT (Left) as a surgical intervention .  The patient's history has been reviewed, patient examined, no change in status, stable for surgery.  I have reviewed the patient's chart and labs.  Questions were answered to the patient's satisfaction.     Jethro BolusANNENBAUM, Anab Vivar I

## 2013-09-02 NOTE — Anesthesia Procedure Notes (Signed)
Procedure Name: LMA Insertion Date/Time: 09/02/2013 7:47 PM Performed by: Edison PaceGRAY, Beena Catano E Pre-anesthesia Checklist: Patient identified, Emergency Drugs available, Suction available, Patient being monitored and Timeout performed Patient Re-evaluated:Patient Re-evaluated prior to inductionOxygen Delivery Method: Circle system utilized Preoxygenation: Pre-oxygenation with 100% oxygen LMA: LMA inserted LMA Size: 4.0 Number of attempts: 1 Placement Confirmation: positive ETCO2 Tube secured with: Tape Dental Injury: Teeth and Oropharynx as per pre-operative assessment

## 2013-09-02 NOTE — Transfer of Care (Signed)
Immediate Anesthesia Transfer of Care Note  Patient: Barbette ReichmannCarrie Lynn Highley  Procedure(s) Performed: Procedure(s): CYSTOSCOPY/RETROGRADE/LEFT URETEROSCOPY WITH BACKSTOP, BASKET EXTRACTION AND HOLMIUM LASER AND PlACEMENT OF LEFT STENT (Left)  Patient Location: PACU  Anesthesia Type:General  Level of Consciousness: awake, oriented, patient cooperative, lethargic and responds to stimulation  Airway & Oxygen Therapy: Patient Spontanous Breathing and Patient connected to face mask oxygen  Post-op Assessment: Report given to PACU RN, Post -op Vital signs reviewed and stable and Patient moving all extremities  Post vital signs: Reviewed and stable  Complications: No apparent anesthesia complications

## 2013-09-02 NOTE — H&P (View-Only) (Signed)
  CARE MANAGEMENT ED NOTE 09/02/2013  Patient:  Connie Green,Connie Green   Account Number:  [id number]  Date Initiated:  09/02/2013  Documentation initiated by:  Edd ArbourGIBBS,Macgregor Aeschliman  Subjective/Objective Assessment:   28 yr old medicaid [place] access [place] pt diagnosed with 7 mm kidney stone diagnosed here Saturday. Pt pain became worse this morning.  Pain in left flank.     Subjective/Objective Assessment Detail:   Pt states she understands information shared with her  At bedside two children under age of 2 Female visitor stepped away  [facility] states she has only [facility] once     Action/Plan:   ED Cm spoke with  patient to [facility] Discussed with her that if she prefers to be seen by another provider she will need to speak with her case worker at DSS after reviewing list provided   Action/Plan Detail:   by CM & to call the provider to make sure new pts are being accepted Then have case worker  change to new provider in DSS data base  EPIC updated   Anticipated DC Date:  09/05/2013     Status Recommendation to Physician:   Result of Recommendation:    Other ED Services  Consult Working Plan    DC Planning Services  Other  PCP issues  Outpatient Services - Pt will follow up    Choice offered to / List presented to:            Status of service:  Completed, signed off  ED Comments:   ED Comments Detail:  Follow-up With Details Comments Contact Info [facility] Schedule an appointment as soon as possible for a visit in 1 week follow with family doctor 1-2 weeks after discharge from hospital  [address] [place] [address] [phone]

## 2013-09-02 NOTE — Op Note (Signed)
Pre-operative diagnosis : Left lower ureteral stone with recurrent colic, 7 mm  Postoperative diagnosis:  Same  Operation:  Cystourethroscopy, left retrograde PolyGram interpretation, placement of Backstop, ureteroscopy with laser fragmentation of stone, basket extraction of stone fragments, left double-J stent(6 [place] by 24 cm double-J stent)  Surgeon:  S. Patsi Searsannenbaum, MD  First assistant:  None  Anesthesia:  General LMA  Preparation:  After appropriate preanesthesia, the patient was brought the operative room, placed in the upper table in the dorsal supine position where general LMA anesthesia was introduced. She was then replaced in the dorsal lithotomy position with pubis was prepped with Betadine solution and draped in usual fashion. The history was double checked. The arm band was double checked.  Review history:  28 yo female with recurrent L  Ureteral colic 2ndary 7mm L lower ureteral stone.   Statement of  Likelihood of Success: Excellent. TIME-OUT observed.:  Procedure:  Cystourethroscopy was accomplished showing normal appearing urethra and normal appearing bladder base. Left retropyelogram was performed, showing 7 mm multilobulated irregular stone in the lower ureter. The stone was best identified with triple magnification. He was poorly identified without magnification.  Ureteroscopy was accomplished, which showed the stone, and the stone was photographed. Backstop was then placed above the stone in standard fashion. Using the 200  laser fiber, with laser settings of 0.5/6, the stone was fragmented into small pieces, and the pieces were basket extracted. A 6 French by 24 semiurgent double-J stent was coiled in the renal pelvis and in the bladder. The patient received IV Tylenol, and was awakened, and taken to recovery room in good condition.

## 2013-09-02 NOTE — Anesthesia Postprocedure Evaluation (Signed)
  Anesthesia Post-op Note  Patient: Connie Green  Procedure(s) Performed: Procedure(s) (LRB): CYSTOSCOPY/RETROGRADE/LEFT URETEROSCOPY WITH BACKSTOP, BASKET EXTRACTION AND HOLMIUM LASER AND PlACEMENT OF LEFT STENT (Left)  Patient Location: PACU  Anesthesia Type: General  Level of Consciousness: awake and alert   Airway and Oxygen Therapy: Patient Spontanous Breathing  Post-op Pain: mild  Post-op Assessment: Post-op Vital signs reviewed, Patient's Cardiovascular Status Stable, Respiratory Function Stable, Patent Airway and No signs of Nausea or vomiting  Last Vitals:  Filed Vitals:   09/02/13 2100  BP: 118/67  Pulse:   Temp:   Resp: 12    Post-op Vital Signs: stable   Complications: No apparent anesthesia complications

## 2013-09-02 NOTE — Progress Notes (Signed)
  CARE MANAGEMENT ED NOTE 09/02/2013  Patient:  Eick,Denver LYNN   Account Number:  [id number]  Date Initiated:  09/02/2013  Documentation initiated by:  Calhoun Reichardt  Subjective/Objective Assessment:   28 yr old medicaid [place] access [place] pt diagnosed with 7 mm kidney stone diagnosed here Saturday. Pt pain became worse this morning.  Pain in left flank.     Subjective/Objective Assessment Detail:   Pt states she understands information shared with her  At bedside two children under age of 2 Female visitor stepped away  [facility] states she [facility] only [facility]     Action/Plan:   ED Cm spoke with  patient to confirm [facility] in EPIC Discussed with her that if she prefers to be seen by another provider she will need to speak with her case worker at [facility] after reviewing list provided   Action/Plan Detail:   by CM & to call the provider to make sure new pts are being accepted Then have case worker  change to new provider in DSS data base  EPIC updated   Anticipated DC Date:  09/05/2013     Status Recommendation to Physician:   Result of Recommendation:    Other ED Services  Consult Working Plan    DC Planning Services  Other  PCP issues  Outpatient Services - Pt will follow up    Choice offered to / List presented to:            Status of service:  Completed, signed off  ED Comments:   ED Comments Detail:  Follow-up With Details Comments Contact Info [facility] Schedule an appointment as soon as possible for a visit in 1 week follow with family doctor 1-2 weeks after discharge from hospital  [address] [place] [phone]     

## 2013-09-03 ENCOUNTER — Encounter (HOSPITAL_COMMUNITY): Payer: Self-pay | Admitting: Urology

## 2013-09-03 MED ORDER — PHENAZOPYRIDINE HCL 200 MG PO TABS: 200.0000 mg | ORAL_TABLET | Freq: Three times a day (TID) | ORAL | Status: DC | PRN

## 2013-09-03 MED ORDER — TRAMADOL-ACETAMINOPHEN 37.5-325 MG PO TABS: 1.0000 | ORAL_TABLET | Freq: Four times a day (QID) | ORAL | Status: DC | PRN

## 2013-09-03 NOTE — Discharge Instructions (Signed)
Kidney Stones °Kidney stones (urolithiasis) are solid masses that form inside your kidneys. The intense pain is caused by the stone moving through the kidney, ureter, bladder, and urethra (urinary tract). When the stone moves, the ureter starts to spasm around the stone. The stone is usually passed in your pee (urine).  °HOME CARE °· Drink enough fluids to keep your pee clear or pale yellow. This helps to get the stone out. °· Strain all pee through the provided strainer. Do not pee without peeing through the strainer, not even once. If you pee the stone out, catch it in the strainer. The stone may be as small as a grain of salt. Take this to your doctor. This will help your doctor figure out what you can do to try to prevent more kidney stones. °· Only take medicine as told by your doctor. °· Follow up with your doctor as told. °· Get follow-up X-rays as told by your doctor. °GET HELP IF: °You have pain that gets worse even if you have been taking pain medicine. °GET HELP RIGHT AWAY IF:  °· Your pain does not get better with medicine. °· You have a fever or shaking chills. °· Your pain increases and gets worse over 18 hours. °· You have new belly (abdominal) pain. °· You feel faint or pass out. °· You are unable to pee. °MAKE SURE YOU:  °· Understand these instructions. °· Will watch your condition. °· Will get help right away if you are not doing well or get worse. °Document Released: 06/22/2007 Document Revised: 09/05/2012 Document Reviewed: 06/06/2012 °ExitCare® Patient Information ©2015 [employer]. This information is not intended to replace advice given to you by your health care provider. Make sure you discuss any questions you have with your health care provider. ° °Dietary Guidelines to Help Prevent Kidney Stones °Your risk of kidney stones can be decreased by adjusting the foods you eat. The most important thing you can do is drink enough fluid. You should drink enough fluid to keep your urine clear or  pale yellow. The following guidelines provide specific information for the type of kidney stone you have had. °GUIDELINES ACCORDING TO TYPE OF KIDNEY STONE °Calcium Oxalate Kidney Stones °· Reduce the amount of salt you eat. Foods that have a lot of salt cause your body to release excess calcium into your urine. The excess calcium can combine with a substance called oxalate to form kidney stones. °· Reduce the amount of animal protein you eat if the amount you eat is excessive. Animal protein causes your body to release excess calcium into your urine. Ask your dietitian how much protein from animal sources you should be eating. °· Avoid foods that are high in oxalates. If you take vitamins, they should have less than 500 mg of vitamin C. Your body turns vitamin C into oxalates. You do not need to avoid fruits and vegetables high in vitamin C. °Calcium Phosphate Kidney Stones °· Reduce the amount of salt you eat to help prevent the release of excess calcium into your urine. °· Reduce the amount of animal protein you eat if the amount you eat is excessive. Animal protein causes your body to release excess calcium into your urine. Ask your dietitian how much protein from animal sources you should be eating. °· Get enough calcium from food or take a calcium supplement (ask your dietitian for recommendations). Food sources of calcium that do not increase your risk of kidney stones include: °¨ Broccoli. °¨ Dairy products,   such as cheese and yogurt. °¨ Pudding. °Uric Acid Kidney Stones °· Do not have more than 6 oz of animal protein per day. °FOOD SOURCES °Animal Protein Sources °· Meat (all types). °· Poultry. °· Eggs. °· Fish, seafood. °Foods High in Salt °· Salt seasonings. °· Soy sauce. °· Teriyaki sauce. °· Cured and processed meats. °· Salted crackers and snack foods. °· Fast food. °· Canned soups and most canned foods. °Foods High in Oxalates °· Grains: °¨ Amaranth. °¨ Barley. °¨ Grits. °¨ Wheat  germ. °¨ Bran. °¨ Buckwheat flour. °¨ All bran cereals. °¨ Pretzels. °¨ Whole wheat bread. °· Vegetables: °¨ Beans (wax). °¨ Beets and beet greens. °¨ Collard greens. °¨ Eggplant. °¨ Escarole. °¨ Leeks. °¨ Okra. °¨ Parsley. °¨ Rutabagas. °¨ Spinach. °¨ Swiss chard. °¨ Tomato paste. °¨ Fried potatoes. °¨ Sweet potatoes. °· Fruits: °¨ Red currants. °¨ Figs. °¨ Kiwi. °¨ Rhubarb. °· Meat and Other Protein Sources: °¨ Beans (dried). °¨ Soy burgers and other soybean products. °¨ Miso. °¨ Nuts (peanuts, almonds, pecans, cashews, hazelnuts). °¨ Nut butters. °¨ Sesame seeds and tahini (paste made of sesame seeds). °¨ Poppy seeds. °· Beverages: °¨ Chocolate drink mixes. °¨ Soy milk. °¨ Instant iced tea. °¨ Juices made from high-oxalate fruits or vegetables. °· Other: °¨ Carob. °¨ Chocolate. °¨ Fruitcake. °¨ Marmalades. °Document Released: 04/30/2010 Document Revised: 01/08/2013 Document Reviewed: 11/30/2012 °ExitCare® Patient Information ©2015 [employer]. This information is not intended to replace advice given to you by your health care provider. Make sure you discuss any questions you have with your health care provider. ° °

## 2013-09-03 NOTE — Discharge Summary (Signed)
Physician Discharge Summary  Patient ID: Connie Green MRN: [id number] DOB/AGE: 272-Apr-1987 28 y.o.  Admit date: 09/02/2013 Discharge date: 09/03/2013  Admission Diagnoses: Kidney stone [592.0]  Discharge Diagnoses:  Active Problems:   Kidney stone   Left ureteral stone   Discharged Condition: improved  Hospital Course:   Procedure(s) Performed: Procedure(s) (LRB):  CYSTOSCOPY/RETROGRADE/LEFT URETEROSCOPY WITH BACKSTOP, BASKET EXTRACTION AND HOLMIUM LASER AND PlACEMENT OF LEFT STENT (Left)   Consults: none  Significant Diagnostic Studies: Dg Abd 1 View  09/02/2013   CLINICAL DATA:  Nephrocalcinosis  EXAM: ABDOMEN - 1 VIEW  COMPARISON:  CT abdomen and pelvis August 31, 2013  FINDINGS: There is a focal calcification in the left pelvis measuring 9 by 6 mm suspicious for calculus. A second calcification more distally in the left pelvis measures 5 x 4 mm, possibly a second ureteral calculus. No other abnormal calcifications are identified. Note that there is extensive stool obscuring the kidneys. Bowel gas pattern is unremarkable. No obstruction or free air is seen on this supine examination.  IMPRESSION: Suspect persistent distal left ureteral calculi. Bowel gas pattern unremarkable. Diffuse stool throughout colon. Note that stool obscures the kidneys and precludes evaluation for potential intrarenal calculi.   Electronically Signed   By: Bretta BangWilliam  Woodruff M.D.   On: 09/02/2013 10:15    surgery  Discharge Exam: Blood pressure 103/59, pulse 58, temperature 98 F (36.7 C), temperature source Oral, resp. rate 18, height 5\' 7"  (1.702 m), weight 108.863 kg (240 lb), last menstrual period 08/30/2013, SpO2 99.00%. General appearance: alert and cooperative  Disposition: 01-Home or Self Care  Discharge Instructions   Discharge patient    Complete by:  As directed      Discontinue IV    Complete by:  As directed             Medication List         ibuprofen 600 MG tablet   Commonly known as:  ADVIL,MOTRIN  Take 1 tablet (600 mg total) by mouth every 6 (six) hours as needed.     oxyCODONE-acetaminophen 5-325 MG per tablet  Commonly known as:  PERCOCET/ROXICET  Take 1-2 tablets by mouth every 6 (six) hours as needed for severe pain.     phenazopyridine 200 MG tablet  Commonly known as:  PYRIDIUM  Take 1 tablet (200 mg total) by mouth 3 (three) times daily as needed for pain.     tamsulosin 0.4 MG Caps capsule  Commonly known as:  FLOMAX  Take 1 capsule (0.4 mg total) by mouth daily.     traMADol-acetaminophen 37.5-325 MG per tablet  Commonly known as:  ULTRACET  Take 1 tablet by mouth every 6 (six) hours as needed.           Follow-up Information   [facility] Schedule an appointment as soon as possible for a visit in 1 week. (follow with family doctor 1-2 weeks after discharge from hospital )    Specialty:  Family Medicine   Contact information:   [address] [place] [address] [phone]       Follow up with Jethro BolusANNENBAUM, Iasha Mccalister I, MD. Schedule an appointment as soon as possible for a visit in 1 week. (stent removal)    Specialty:  Urology   Contact information:   [address] [place] [address] [phone]       Signed: Jethro BolusANNENBAUM, Zalen Sequeira I 09/03/2013, 8:33 AM

## 2013-09-03 NOTE — Progress Notes (Signed)
Urology Progress Note  1 Day Post-Op   Subjective: Procedure(s) Performed: Procedure(s) (LRB):  CYSTOSCOPY/RETROGRADE/LEFT URETEROSCOPY WITH BACKSTOP, BASKET EXTRACTION AND HOLMIUM LASER AND PlACEMENT OF LEFT STENT (Left)   Pt feels better. No more colic, post laser and basket extraction fo stone.       No acute urologic events overnight. Ambulation:   negative Flatus:    positive Bowel movement  negative  Pain: complete resolution  Objective:  Blood pressure 103/59, pulse 58, temperature 98 F (36.7 C), temperature source Oral, resp. rate 18, height 5\' 7"  (1.702 m), weight 108.863 kg (240 lb), last menstrual period 08/30/2013, SpO2 99.00%.  Physical Exam:  General:  No acute distress, awake Extremities: extremities normal, atraumatic, no cyanosis or edema Genitourinary:   Normal BUS. No CVA pain. Abd: soft. No pain.  Foley:  ouyt    I/O last 3 completed shifts: In: 1449.2 [I.V.:1449.2] Out: 1350 [Urine:1350]  Recent Labs     09/02/13  0915  09/02/13  1635  HGB  13.0  12.8  WBC  8.3  8.8  PLT  272  266    Recent Labs     09/02/13  0915  09/02/13  1635  NA  140  141  K  4.0  4.0  CL  103  103  CO2  24  28  BUN  16  13  CREATININE  0.95  0.89  CALCIUM  8.7  8.8  GFRNONAA  80*  86*  GFRAA  >90  >90     No results found for this basename: PT, INR, APTT,  in the last 72 hours   No components found with this basename: ABG,   Assessment/Plan:  Pt is to increase activities as tolerated. May drive starting today. Return to full duty in 1 week.  No cola products: ok for 7-up, ginger ale, sprite. Follow up in 1 week for cysto, JJ removal.

## 2013-09-05 ENCOUNTER — Emergency Department (HOSPITAL_COMMUNITY)
Admission: EM | Admit: 2013-09-05 | Discharge: 2013-09-05 | Disposition: A | Discharge status: Home / Self Care | Payer: Medicaid Other | Attending: Emergency Medicine | Admitting: Emergency Medicine

## 2013-09-05 ENCOUNTER — Encounter (HOSPITAL_COMMUNITY): Payer: Self-pay | Admitting: Emergency Medicine

## 2013-09-05 DIAGNOSIS — R141 Gas pain: Secondary | ICD-10-CM | POA: Diagnosis not present

## 2013-09-05 DIAGNOSIS — Z3202 Encounter for pregnancy test, result negative: Secondary | ICD-10-CM | POA: Diagnosis not present

## 2013-09-05 DIAGNOSIS — R1032 Left lower quadrant pain: Secondary | ICD-10-CM | POA: Insufficient documentation

## 2013-09-05 DIAGNOSIS — R300 Dysuria: Secondary | ICD-10-CM | POA: Insufficient documentation

## 2013-09-05 DIAGNOSIS — R143 Flatulence: Secondary | ICD-10-CM

## 2013-09-05 DIAGNOSIS — R109 Unspecified abdominal pain: Secondary | ICD-10-CM

## 2013-09-05 DIAGNOSIS — R112 Nausea with vomiting, unspecified: Secondary | ICD-10-CM | POA: Diagnosis not present

## 2013-09-05 DIAGNOSIS — F17200 Nicotine dependence, unspecified, uncomplicated: Secondary | ICD-10-CM | POA: Insufficient documentation

## 2013-09-05 DIAGNOSIS — R1012 Left upper quadrant pain: Secondary | ICD-10-CM | POA: Insufficient documentation

## 2013-09-05 DIAGNOSIS — Z87442 Personal history of urinary calculi: Secondary | ICD-10-CM | POA: Insufficient documentation

## 2013-09-05 DIAGNOSIS — R142 Eructation: Secondary | ICD-10-CM

## 2013-09-05 DIAGNOSIS — R319 Hematuria, unspecified: Secondary | ICD-10-CM | POA: Diagnosis not present

## 2013-09-05 DIAGNOSIS — R350 Frequency of micturition: Secondary | ICD-10-CM | POA: Diagnosis not present

## 2013-09-05 DIAGNOSIS — Z9889 Other specified postprocedural states: Secondary | ICD-10-CM | POA: Diagnosis not present

## 2013-09-05 DIAGNOSIS — K5900 Constipation, unspecified: Secondary | ICD-10-CM | POA: Insufficient documentation

## 2013-09-05 LAB — CBC WITH DIFFERENTIAL/PLATELET
BASOS ABS: 0 10*3/uL (ref 0.0–0.1)
Basophils Relative: 1 % (ref 0–1)
EOS PCT: 4 % (ref 0–5)
Eosinophils Absolute: 0.3 10*3/uL (ref 0.0–0.7)
HEMATOCRIT: 37.5 % (ref 36.0–46.0)
Hemoglobin: 12.4 g/dL (ref 12.0–15.0)
LYMPHS PCT: 16 % (ref 12–46)
Lymphs Abs: 1.3 10*3/uL (ref 0.7–4.0)
MCH: 29.2 pg (ref 26.0–34.0)
MCHC: 33.1 g/dL (ref 30.0–36.0)
MCV: 88.2 fL (ref 78.0–100.0)
MONO ABS: 0.5 10*3/uL (ref 0.1–1.0)
MONOS PCT: 7 % (ref 3–12)
Neutro Abs: 5.8 10*3/uL (ref 1.7–7.7)
Neutrophils Relative %: 72 % (ref 43–77)
Platelets: 246 10*3/uL (ref 150–400)
RBC: 4.25 MIL/uL (ref 3.87–5.11)
RDW: 13.8 % (ref 11.5–15.5)
WBC: 7.9 10*3/uL (ref 4.0–10.5)

## 2013-09-05 LAB — URINALYSIS, ROUTINE W REFLEX MICROSCOPIC
Bilirubin Urine: NEGATIVE
Glucose, UA: NEGATIVE mg/dL
Ketones, ur: NEGATIVE mg/dL
Nitrite: NEGATIVE
Protein, ur: 100 mg/dL — AB
Specific Gravity, Urine: 1.016 (ref 1.005–1.030)
UROBILINOGEN UA: 0.2 mg/dL (ref 0.0–1.0)
pH: 7 (ref 5.0–8.0)

## 2013-09-05 LAB — COMPREHENSIVE METABOLIC PANEL
ALT: 92 U/L — ABNORMAL HIGH (ref 0–35)
AST: 57 U/L — AB (ref 0–37)
Albumin: 3.8 g/dL (ref 3.5–5.2)
Alkaline Phosphatase: 85 U/L (ref 39–117)
Anion gap: 13 (ref 5–15)
BILIRUBIN TOTAL: 0.2 mg/dL — AB (ref 0.3–1.2)
BUN: 14 mg/dL (ref 6–23)
CO2: 24 meq/L (ref 19–32)
CREATININE: 0.75 mg/dL (ref 0.50–1.10)
Calcium: 9.5 mg/dL (ref 8.4–10.5)
Chloride: 102 mEq/L (ref 96–112)
GFR calc non Af Amer: >90 mL/min (ref >90)
GLUCOSE: 110 mg/dL — AB (ref 70–99)
Potassium: 3.8 mEq/L (ref 3.7–5.3)
Sodium: 139 mEq/L (ref 137–147)
Total Protein: 6.9 g/dL (ref 6.0–8.3)

## 2013-09-05 LAB — LIPASE, BLOOD: LIPASE: 15 U/L (ref 11–59)

## 2013-09-05 LAB — POC URINE PREG, ED: PREG TEST UR: NEGATIVE

## 2013-09-05 LAB — URINE MICROSCOPIC-ADD ON

## 2013-09-05 MED ORDER — POLYETHYLENE GLYCOL 3350 17 G PO PACK: 17.0000 g | PACK | Freq: Every day | ORAL | Status: DC | PRN

## 2013-09-05 MED ORDER — HYDROMORPHONE HCL PF 1 MG/ML IJ SOLN
1.0000 mg | Freq: Once | INTRAMUSCULAR | Status: AC
  Administered 2013-09-05: 1 mg via INTRAVENOUS
  Filled 2013-09-05: qty 1

## 2013-09-05 MED ORDER — HYDROMORPHONE HCL PF 1 MG/ML IJ SOLN
0.5000 mg | Freq: Once | INTRAMUSCULAR | Status: AC
  Administered 2013-09-05: 0.5 mg via INTRAVENOUS
  Filled 2013-09-05: qty 1

## 2013-09-05 MED ORDER — SODIUM CHLORIDE 0.9 % IV BOLUS (SEPSIS)
1000.0000 mL | Freq: Once | INTRAVENOUS | Status: AC
  Administered 2013-09-05: 1000 mL via INTRAVENOUS

## 2013-09-05 MED ORDER — ONDANSETRON HCL 4 MG/2ML IJ SOLN
4.0000 mg | Freq: Once | INTRAMUSCULAR | Status: AC
  Administered 2013-09-05: 4 mg via INTRAVENOUS
  Filled 2013-09-05: qty 2

## 2013-09-05 MED ORDER — LORAZEPAM 2 MG/ML IJ SOLN
0.5000 mg | Freq: Once | INTRAMUSCULAR | Status: AC
  Administered 2013-09-05: 0.5 mg via INTRAVENOUS
  Filled 2013-09-05: qty 1

## 2013-09-05 MED ORDER — OXYCODONE-ACETAMINOPHEN 5-325 MG PO TABS
1.0000 | ORAL_TABLET | Freq: Once | ORAL | Status: AC
  Administered 2013-09-05: 1 via ORAL
  Filled 2013-09-05: qty 1

## 2013-09-05 MED ORDER — ONDANSETRON HCL 8 MG PO TABS: 8.0000 mg | ORAL_TABLET | Freq: Three times a day (TID) | ORAL | Status: DC | PRN

## 2013-09-05 MED ORDER — OXYCODONE-ACETAMINOPHEN 5-325 MG PO TABS: 1.0000 | ORAL_TABLET | Freq: Four times a day (QID) | ORAL | Status: DC | PRN

## 2013-09-05 NOTE — ED Notes (Signed)
Went to collect labs - pt on the phone.  Then said she had to use the bathroom.  RN to notify me when i can get labs.

## 2013-09-05 NOTE — ED Provider Notes (Signed)
CSN: [id number]     Arrival date & time 09/05/13  1707 History   First MD Initiated Contact with Patient 09/05/13 1716     Chief Complaint  Patient presents with  . Flank Pain     (Consider location/radiation/quality/duration/timing/severity/associated sxs/prior Treatment) HPI Comments: Connie Green is a 28 y.o. Female with a PMHx of nephrolithiasis s/p cystoscopy/retrograde/ureteroscopy with basket removal of stones on 09/02/13 who presents to the ED today for increasing L flank pain after having a hard BM earlier this afternoon. Pain is 10/10, constant, sharp, in the L flank, radiating into the L abdomen, worse with "everything" including movement, and without known alleviating factors. Pt reports that after being discharged from the hospital on 09/03/13, she used the percocet which was given to her in the ED, until she ran out yesterday, but states this made her sleep through all the pain. States today she's only been using motrin 600mg  without relief. Associated symptoms include N/V (nonbloody nonbilious, stomach food contents), hematuria, dysuria, increased frequency and urgency, constipation/hard stool, and bloating. Continues to pass flatus today. Stool was free of hematochezia or melena. Denies fevers, chills, CP, SOB, cough, diarrhea, hematemesis, myalgias, arthralgias, vaginal discharge or bleeding, or syncope. Was supposed to f/up with Dr. Patsi Searsannenbaum in 1 week.  Patient is a 28 y.o. female presenting with flank pain. The history is provided by the patient. No language interpreter was used.  Flank Pain This is a recurrent problem. The current episode started today. The problem occurs constantly. The problem has been unchanged. Associated symptoms include abdominal pain (radiating from L flank), a change in bowel habit (hardened), nausea, urinary symptoms and vomiting. Pertinent negatives include no arthralgias, chest pain, chills, congestion, coughing, diaphoresis, fever, headaches,  myalgias, numbness, sore throat, vertigo or weakness. Exacerbated by: "everything" She has tried NSAIDs for the symptoms. The treatment provided mild relief.    Past Medical History  Diagnosis Date  . Kidney stones    Past Surgical History  Procedure Laterality Date  . Lithotripsy    . Bladder repair w/ cesarean section      x4  . Wisdom tooth extraction    . Cystoscopy/retrograde/ureteroscopy Left 09/02/2013    Procedure: CYSTOSCOPY/RETROGRADE/LEFT URETEROSCOPY WITH BACKSTOP, BASKET EXTRACTION AND HOLMIUM LASER AND PlACEMENT OF LEFT STENT;  Surgeon: Kathi LudwigSigmund I Tannenbaum, MD;  Location: [facility];  Service: Urology;  Laterality: Left;   History reviewed. No pertinent family history. History  Substance Use Topics  . Smoking status: Current Every Day Smoker -- 0.50 packs/day    Types: Cigarettes  . Smokeless tobacco: Never Used  . Alcohol Use: No   OB History   Grav Para Term Preterm Abortions TAB SAB Ect Mult Living                 Review of Systems  Constitutional: Negative for fever, chills and diaphoresis.  HENT: Negative for congestion, postnasal drip and sore throat.   Respiratory: Negative for cough.   Cardiovascular: Negative for chest pain.  Gastrointestinal: Positive for nausea, vomiting, abdominal pain (radiating from L flank), constipation (prior to today's BM), abdominal distention ("bloated feeling") and change in bowel habit (hardened). Negative for diarrhea, blood in stool and rectal pain.  Genitourinary: Positive for dysuria, urgency, frequency, hematuria and flank pain. Negative for decreased urine volume, vaginal bleeding, vaginal discharge and vaginal pain.  Musculoskeletal: Negative for arthralgias and myalgias.  Skin: Negative for color change.  Neurological: Negative for dizziness, vertigo, syncope, weakness, light-headedness, numbness and headaches.  Psychiatric/Behavioral: Negative for  confusion.  10 Systems reviewed and are negative for acute change except  as noted in the HPI.     Allergies  Review of patient's allergies indicates no known allergies.  Home Medications   Prior to Admission medications   Medication Sig Start Date End Date Taking? Authorizing Provider  ibuprofen (ADVIL,MOTRIN) 600 MG tablet Take 1 tablet (600 mg total) by mouth every 6 (six) hours as needed. 08/31/13  Yes Renne Crigler, PA-C  oxyCODONE-acetaminophen (PERCOCET/ROXICET) 5-325 MG per tablet Take 1-2 tablets by mouth every 6 (six) hours as needed for severe pain. 08/31/13  Yes Renne Crigler, PA-C  phenazopyridine (PYRIDIUM) 200 MG tablet Take 1 tablet (200 mg total) by mouth 3 (three) times daily as needed for pain. 09/03/13  Yes Kathi Ludwig, MD  tamsulosin (FLOMAX) 0.4 MG CAPS capsule Take 1 capsule (0.4 mg total) by mouth daily. 08/31/13  Yes Renne Crigler, PA-C  traMADol-acetaminophen (ULTRACET) 37.5-325 MG per tablet Take 1 tablet by mouth every 6 (six) hours as needed. 09/03/13  Yes Kathi Ludwig, MD  ondansetron (ZOFRAN) 8 MG tablet Take 1 tablet (8 mg total) by mouth every 8 (eight) hours as needed for nausea or vomiting. 09/05/13   Donnita Falls Camprubi-Soms, PA-C  oxyCODONE-acetaminophen (PERCOCET) 5-325 MG per tablet Take 1-2 tablets by mouth every 6 (six) hours as needed for severe pain. 09/05/13   Kanishk Stroebel Strupp Camprubi-Soms, PA-C  polyethylene glycol (MIRALAX / GLYCOLAX) packet Take 17 g by mouth daily as needed for moderate constipation. 09/05/13   Oneal Schoenberger Strupp Camprubi-Soms, PA-C   BP 130/80  Pulse 57  Temp(Src) 99.3 F (37.4 C) (Oral)  Resp 20  SpO2 100%  LMP 08/30/2013 Physical Exam  Nursing note and vitals reviewed. Constitutional: She is oriented to person, place, and time. Vital signs are normal. She appears well-developed and well-nourished. She appears distressed.  Actively vomiting and crying in pain, screaming during exam but able to calm down intermittently when re-directed  HENT:  Head: Normocephalic and atraumatic.    Mouth/Throat: Oropharynx is clear and moist and mucous membranes are normal.  Eyes: Conjunctivae and EOM are normal. Right eye exhibits no discharge. Left eye exhibits no discharge.  Neck: Normal range of motion. Neck supple.  Cardiovascular: Normal rate, regular rhythm, normal heart sounds and intact distal pulses.   No murmur heard. Pulmonary/Chest: Effort normal and breath sounds normal. She has no wheezes. She has no rales.  Abdominal: Soft. Normal appearance. She exhibits no distension. Bowel sounds are decreased. There is tenderness in the suprapubic area, left upper quadrant and left lower quadrant. There is CVA tenderness (left). There is no rigidity, no rebound, no guarding, no tenderness at McBurney's point and negative Murphy's sign.    Diminished bowel sounds, soft, nondistended although obese and body habitus obscures this exam finding. Exquisitely TTP along L sided abdomen and +L CVA TTP. No r/g/r, neg murphy's and neg mcburney's.  Musculoskeletal: Normal range of motion.  Neurological: She is alert and oriented to person, place, and time.  Skin: Skin is warm, dry and intact. No rash noted.  Psychiatric: She has a normal mood and affect.    ED Course  Procedures (including critical care time) Labs Review Labs Reviewed  URINALYSIS, ROUTINE W REFLEX MICROSCOPIC - Abnormal; Notable for the following:    APPearance CLOUDY (*)    Hgb urine dipstick LARGE (*)    Protein, ur 100 (*)    Leukocytes, UA SMALL (*)    All other components within normal limits  COMPREHENSIVE METABOLIC PANEL - Abnormal; Notable for the following:    Glucose, Bld 110 (*)    AST 57 (*)    ALT 92 (*)    Total Bilirubin 0.2 (*)    All other components within normal limits  URINE CULTURE  CBC WITH DIFFERENTIAL  LIPASE, BLOOD  URINE MICROSCOPIC-ADD ON  POC URINE PREG, ED    Imaging Review No results found.   EKG Interpretation None      MDM   Final diagnoses:  Flank pain   Non-intractable vomiting with nausea, vomiting of unspecified type  Dysuria    28y/o female with recent urological procedure for kidney stone removal and stent placement. Pain increased after hard BM today. Bowel sounds diminished but doubt obstruction due to ongoing BM and flatus passage, as well as this pain being more like her kidney stone pain. Will check kidney function and basic labs, U/A and Ucx, and given fluids, dilaudid 1mg  and 4mg  zofran to help with symptom control. Plan to attempt to control symptoms and hope to d/c with pain control and urology f/up as previously set up   7:00 PM Nurse called, pt having increased pain. Will give 0.5mg  ativan and 0.5mg  dilaudid. CBC w/diff WNL. CMP showing mildly bumped AST/ALT at 1:1 ratio, likely related to heavy narcotic use recently. Upreg neg. U/A pending. Will reassess shortly.  7:45 PM U/A showing small leuks, 3-6 WBC but no bacteria reported. Doubt UTI, likely related to recent manipulation. VSS, afebrile, Cx sent. Pt reports dramatically improved pain and no continued nausea. Will PO challenge and plan to d/c with zofran, miralax, and small amount of percocet. Pt hasn't filled ultracet or pyridium yet, states she hadn't been to the pharmacy. Wants rx's here prior to d/c due to concerns with copay at pharmacy. Discussed that this is not possible, but to go tomorrow morning to the pharmacy to fill rx's.   8:28 PM Tolerating PO fluids well. Will d/c now. I explained the diagnosis and have given explicit precautions to return to the ER including for any other new or worsening symptoms. The patient understands and accepts the medical plan as it's been dictated and I have answered their questions. Discharge instructions concerning home care and prescriptions have been given. The patient is STABLE and is discharged to home in good condition.  BP 130/80  Pulse 57  Temp(Src) 99.3 F (37.4 C) (Oral)  Resp 20  SpO2 100%  LMP 08/30/2013  Meds  ordered this encounter  Medications  . sodium chloride 0.9 % bolus 1,000 mL    Sig:   . HYDROmorphone (DILAUDID) injection 1 mg    Sig:   . ondansetron (ZOFRAN) injection 4 mg    Sig:   . LORazepam (ATIVAN) injection 0.5 mg    Sig:   . HYDROmorphone (DILAUDID) injection 0.5 mg    Sig:   . oxyCODONE-acetaminophen (PERCOCET) 5-325 MG per tablet    Sig: Take 1-2 tablets by mouth every 6 (six) hours as needed for severe pain.    Dispense:  6 tablet    Refill:  0    Order Specific Question:  Supervising Provider    Answer:  Eber Hong D [[id number]]  . ondansetron (ZOFRAN) 8 MG tablet    Sig: Take 1 tablet (8 mg total) by mouth every 8 (eight) hours as needed for nausea or vomiting.    Dispense:  10 tablet    Refill:  0    Order Specific Question:  Supervising Provider  Answer:  Eber Hong D [[id number]]  . polyethylene glycol (MIRALAX / GLYCOLAX) packet    Sig: Take 17 g by mouth daily as needed for moderate constipation.    Dispense:  14 each    Refill:  0    Order Specific Question:  Supervising Provider    Answer:  Vida Roller [address] Camprubi-Soms, PA-C 09/05/13 2029

## 2013-09-05 NOTE — ED Notes (Signed)
Pt presents with c/o flank pain 10/10, seen on Monday for the same, reports hematuria, possibility of lithotripsy if the kidney stone symptoms didn't resolve. Received 50mcg of fentanyl en route from EMS.

## 2013-09-05 NOTE — Discharge Instructions (Signed)
Take ibuprofen as directed as needed for inflammation and pain using your home Ultracet or Percocet for breakthrough pain. Do not drive or operate machinery with pain medication use. May need over-the-counter stool softener with this pain medication use, use the miralax I've prescribed today. Followup with urologist at your regularly scheduled appointment next week, however for intractable or uncontrollable pain at home then return to the emergency department. Use Zofran as needed for nausea. Use Pyridium as previously directed, as this medication will help your bladder spasm pain. It will turn your urine orange, this is a normal side effect.   Flank Pain Flank pain is pain in your side. The flank is the area of your side between your upper belly (abdomen) and your back. Pain in this area can be caused by many different things. HOME CARE Home care and treatment will depend on the cause of your pain.  Rest as told by your doctor.  Drink enough fluids to keep your pee (urine) clear or pale yellow.  Only take medicine as told by your doctor.  Tell your doctor about any changes in your pain.  Follow up with your doctor. GET HELP RIGHT AWAY IF:   Your pain does not get better with medicine.   You have new symptoms or your symptoms get worse.  Your pain gets worse.   You have belly (abdominal) pain.   You are short of breath.   You always feel sick to your stomach (nauseous).   You keep throwing up (vomiting).   You have puffiness (swelling) in your belly.   You feel light-headed or you pass out (faint).   You have blood in your pee.  You have a fever or lasting symptoms for more than 2-3 days.  You have a fever and your symptoms suddenly get worse. MAKE SURE YOU:   Understand these instructions.  Will watch your condition.  Will get help right away if you are not doing well or get worse. Document Released: 10/13/2007 Document Revised: 05/20/2013 Document  Reviewed: 08/18/2011 [facility] Patient Information 2015 [place], [place]. This information is not intended to replace advice given to you by your health care provider. Make sure you discuss any questions you have with your health care provider.  Dysuria Dysuria is the medical term for pain with urination. There are many causes for dysuria, but urinary tract infection is the most common. If a urinalysis was performed it can show that there is a urinary tract infection. A urine culture confirms that you or your child is sick. You will need to follow up with a healthcare provider because:  If a urine culture was done you will need to know the culture results and treatment recommendations.  If the urine culture was positive, you or your child will need to be put on antibiotics or know if the antibiotics prescribed are the right antibiotics for your urinary tract infection.  If the urine culture is negative (no urinary tract infection), then other causes may need to be explored or antibiotics need to be stopped. Today laboratory work may have been done and there does not seem to be an infection. If cultures were done they will take at least 24 to 48 hours to be completed. Today x-rays may have been taken and they read as normal. No cause can be found for the problems. The x-rays may be re-read by a radiologist and you will be contacted if additional findings are made. You or your child may have been put on  medications to help with this problem until you can see your primary caregiver. If the problems get better, see your primary caregiver if the problems return. If you were given antibiotics (medications which kill germs), take all of the mediations as directed for the full course of treatment.  If laboratory work was done, you need to find the results. Leave a telephone number where you can be reached. If this is not possible, make sure you find out how you are to get test results. HOME CARE INSTRUCTIONS     Drink lots of fluids. For adults, drink eight, 8 ounce glasses of clear juice or water a day. For children, replace fluids as suggested by your caregiver.  Empty the bladder often. Avoid holding urine for long periods of time.  After a bowel movement, women should cleanse front to back, using each tissue only once.  Empty your bladder before and after sexual intercourse.  Take all the medicine given to you until it is gone. You may feel better in a few days, but TAKE ALL MEDICINE.  Avoid caffeine, tea, alcohol and carbonated beverages, because they tend to irritate the bladder.  In men, alcohol may irritate the prostate.  Only take over-the-counter or prescription medicines for pain, discomfort, or fever as directed by your caregiver.  If your caregiver has given you a follow-up appointment, it is very important to keep that appointment. Not keeping the appointment could result in a chronic or permanent injury, pain, and disability. If there is any problem keeping the appointment, you must call back to this facility for assistance. SEEK IMMEDIATE MEDICAL CARE IF:   Back pain develops.  A fever develops.  There is nausea (feeling sick to your stomach) or vomiting (throwing up).  Problems are no better with medications or are getting worse. MAKE SURE YOU:   Understand these instructions.  Will watch your condition.  Will get help right away if you are not doing well or get worse. Document Released: 10/02/2003 Document Revised: 03/28/2011 Document Reviewed: 08/09/2007 [facility] Patient Information 2015 [place]. This information is not intended to replace advice given to you by your health care provider. Make sure you discuss any questions you have with your health care provider.  Nausea and Vomiting Nausea means you feel sick to your stomach. Throwing up (vomiting) is a reflex where stomach contents come out of your mouth. HOME CARE   Take medicine as told by your  doctor.  Do not force yourself to eat. However, you do need to drink fluids.  If you feel like eating, eat a normal diet as told by your doctor.  Eat rice, wheat, potatoes, bread, lean meats, yogurt, fruits, and vegetables.  Avoid high-fat foods.  Drink enough fluids to keep your pee (urine) clear or pale yellow.  Ask your doctor how to replace body fluid losses (rehydrate). Signs of body fluid loss (dehydration) include:  Feeling very thirsty.  Dry lips and mouth.  Feeling dizzy.  Dark pee.  Peeing less than normal.  Feeling confused.  Fast breathing or heart rate. GET HELP RIGHT AWAY IF:   You have blood in your throw up.  You have black or bloody poop (stool).  You have a bad headache or stiff neck.  You feel confused.  You have bad belly (abdominal) pain.  You have chest pain or trouble breathing.  You do not pee at least once every 8 hours.  You have cold, clammy skin.  You keep throwing up after 24 to  48 hours.  You have a fever. MAKE SURE YOU:   Understand these instructions.  Will watch your condition.  Will get help right away if you are not doing well or get worse. Document Released: 06/22/2007 Document Revised: 03/28/2011 Document Reviewed: 06/04/2010 [facility] Patient Information 2015 [place], [place]. This information is not intended to replace advice given to you by your health care provider. Make sure you discuss any questions you have with your health care provider.

## 2013-09-05 NOTE — ED Notes (Signed)
Bed: [id number] Expected date:  Expected time:  Means of arrival:  Comments: ems- flank pain

## 2013-09-05 NOTE — ED Provider Notes (Signed)
Medical screening examination/treatment/procedure(s) were performed by non-physician practitioner and as supervising physician I was immediately available for consultation/collaboration.   Twana Wileman T Paulena Servais, MD 09/05/13 2308 

## 2013-09-05 NOTE — ED Notes (Signed)
Went to collect labs - pt on the phone.  TLowella Dandy

## 2013-09-08 ENCOUNTER — Emergency Department (HOSPITAL_COMMUNITY)
Admission: EM | Admit: 2013-09-08 | Discharge: 2013-09-08 | Disposition: A | Discharge status: Home / Self Care | Payer: Medicaid Other | Attending: Emergency Medicine | Admitting: Emergency Medicine

## 2013-09-08 ENCOUNTER — Encounter (HOSPITAL_COMMUNITY): Payer: Self-pay | Admitting: Emergency Medicine

## 2013-09-08 DIAGNOSIS — N23 Unspecified renal colic: Secondary | ICD-10-CM | POA: Insufficient documentation

## 2013-09-08 DIAGNOSIS — Z79899 Other long term (current) drug therapy: Secondary | ICD-10-CM | POA: Diagnosis not present

## 2013-09-08 DIAGNOSIS — F17200 Nicotine dependence, unspecified, uncomplicated: Secondary | ICD-10-CM | POA: Insufficient documentation

## 2013-09-08 DIAGNOSIS — R109 Unspecified abdominal pain: Secondary | ICD-10-CM | POA: Diagnosis present

## 2013-09-08 LAB — BASIC METABOLIC PANEL
Anion gap: 12 (ref 5–15)
BUN: 11 mg/dL (ref 6–23)
CALCIUM: 9.1 mg/dL (ref 8.4–10.5)
CO2: 25 mEq/L (ref 19–32)
Chloride: 99 mEq/L (ref 96–112)
Creatinine, Ser: 0.74 mg/dL (ref 0.50–1.10)
GFR calc Af Amer: >90 mL/min (ref >90)
GFR calc non Af Amer: >90 mL/min (ref >90)
GLUCOSE: 94 mg/dL (ref 70–99)
Potassium: 4.4 mEq/L (ref 3.7–5.3)
SODIUM: 136 meq/L — AB (ref 137–147)

## 2013-09-08 LAB — CBC WITH DIFFERENTIAL/PLATELET
BASOS PCT: 0 % (ref 0–1)
Basophils Absolute: 0 10*3/uL (ref 0.0–0.1)
EOS ABS: 0.3 10*3/uL (ref 0.0–0.7)
EOS PCT: 5 % (ref 0–5)
HCT: 39 % (ref 36.0–46.0)
Hemoglobin: 13.3 g/dL (ref 12.0–15.0)
Lymphocytes Relative: 16 % (ref 12–46)
Lymphs Abs: 1.1 10*3/uL (ref 0.7–4.0)
MCH: 29.7 pg (ref 26.0–34.0)
MCHC: 34.1 g/dL (ref 30.0–36.0)
MCV: 87.1 fL (ref 78.0–100.0)
MONOS PCT: 8 % (ref 3–12)
Monocytes Absolute: 0.5 10*3/uL (ref 0.1–1.0)
Neutro Abs: 4.7 10*3/uL (ref 1.7–7.7)
Neutrophils Relative %: 71 % (ref 43–77)
PLATELETS: 253 10*3/uL (ref 150–400)
RBC: 4.48 MIL/uL (ref 3.87–5.11)
RDW: 13.5 % (ref 11.5–15.5)
WBC: 6.7 10*3/uL (ref 4.0–10.5)

## 2013-09-08 LAB — URINE CULTURE: Special Requests: NORMAL

## 2013-09-08 LAB — URINALYSIS, ROUTINE W REFLEX MICROSCOPIC
GLUCOSE, UA: NEGATIVE mg/dL
Ketones, ur: NEGATIVE mg/dL
Nitrite: POSITIVE — AB
Protein, ur: 100 mg/dL — AB
Specific Gravity, Urine: 1.011 (ref 1.005–1.030)
Urobilinogen, UA: 1 mg/dL (ref 0.0–1.0)
pH: 7.5 (ref 5.0–8.0)

## 2013-09-08 LAB — URINE MICROSCOPIC-ADD ON

## 2013-09-08 MED ORDER — ONDANSETRON HCL 4 MG/2ML IJ SOLN
4.0000 mg | Freq: Once | INTRAMUSCULAR | Status: AC
  Administered 2013-09-08: 4 mg via INTRAVENOUS
  Filled 2013-09-08: qty 2

## 2013-09-08 MED ORDER — HYDROMORPHONE HCL PF 1 MG/ML IJ SOLN
1.0000 mg | Freq: Once | INTRAMUSCULAR | Status: AC
  Administered 2013-09-08: 1 mg via INTRAVENOUS
  Filled 2013-09-08: qty 1

## 2013-09-08 MED ORDER — OXYCODONE-ACETAMINOPHEN 5-325 MG PO TABS: 1.0000 | ORAL_TABLET | Freq: Four times a day (QID) | ORAL | Status: DC | PRN

## 2013-09-08 NOTE — ED Notes (Signed)
Bed: HQ46 Expected date: 09/08/13 Expected time: 2:43 PM Means of arrival:  Comments: Flank pain

## 2013-09-08 NOTE — Discharge Instructions (Signed)
Please read and follow all provided instructions.  Your diagnoses today include:  1. Ureteral colic     Tests performed today include:  Urine test that showed blood in your urine and no definite infection  Urine culture - that that will ensure no infection but will take several days to complete  Blood test that showed normal kidney function  Vital signs. See below for your results today.   Medications prescribed:   Percocet (oxycodone/acetaminophen) - narcotic pain medication  DO NOT drive or perform any activities that require you to be awake and alert because this medicine can make you drowsy. BE VERY CAREFUL not to take multiple medicines containing Tylenol (also called acetaminophen). Doing so can lead to an overdose which can damage your liver and cause liver failure and possibly death.  Take any prescribed medications only as directed.  Home care instructions:  Follow any educational materials contained in this packet.  Please double your fluid intake for the next several days. Strain your urine and save any stones that may pass.   Follow-up instructions: Please follow-up with your urologist or the urologist referral (provided on front page) in the next 1 week for further evaluation of your symptoms.     Return instructions:  If you need to return to the Emergency Department, go to [facility] and not [facility]. The urologists are located at [facility] and can better care for you at this location.        Please return to the Emergency Department if you experience worsening symptoms.  Please return if you develop fever or uncontrolled pain or vomiting.  Please return if you have any other emergent concerns.  Additional Information:  Your vital signs today were: BP 125/68   Pulse 63   Temp(Src) 97.9 F (36.6 C) (Oral)   Resp 20   SpO2 100%   LMP 08/30/2013 If your blood pressure (BP) was elevated above 135/85 this visit, please have this repeated  by your doctor within one month. --------------

## 2013-09-08 NOTE — ED Provider Notes (Signed)
CSN: [id number]     Arrival date & time 09/08/13  1439 History   First MD Initiated Contact with Patient 09/08/13 1508     Chief Complaint  Patient presents with  . Flank Pain     (Consider location/radiation/quality/duration/timing/severity/associated sxs/prior Treatment) HPI Comments: Patient presents with L flank pain and recent kidney stone. Recent history as below:   8/15: Seen in ED by myself with L flank pain. Distal 7mm stone diagnosed by CT. Pain controlled and patient discharged.   8/17-8/18: Patient returns with uncontrolled pain. She was admitted for stone retrieval and stenting. Discharged home with pain controlled.  8/20: L flank pain returned. Seen in ED. Pain controlled. D/c to home.   8/23: Returns with L back pain radiating to L flank starting this morning -- same as previous. Associated with N/V. No fever, diarrhea. Some dysuria. She is taking Ultracet and pyridium without relief. Fentanyl en route which helped somewhat. She is to f/u with urologist next week. The onset of this condition was acute. The course is constant. Aggravating factors: none. Alleviating factors: none.    Patient is a 28 y.o. female presenting with flank pain. The history is provided by the patient and medical records.  Flank Pain Associated symptoms include nausea and vomiting. Pertinent negatives include no abdominal pain, chest pain, coughing, fever, headaches, myalgias, rash or sore throat.    Past Medical History  Diagnosis Date  . Kidney stones    Past Surgical History  Procedure Laterality Date  . Lithotripsy    . Bladder repair w/ cesarean section      x4  . Wisdom tooth extraction    . Cystoscopy/retrograde/ureteroscopy Left 09/02/2013    Procedure: CYSTOSCOPY/RETROGRADE/LEFT URETEROSCOPY WITH BACKSTOP, BASKET EXTRACTION AND HOLMIUM LASER AND PlACEMENT OF LEFT STENT;  Surgeon: Kathi Ludwig, MD;  Location: [facility];  Service: Urology;  Laterality: Left;   No family history  on file. History  Substance Use Topics  . Smoking status: Current Every Day Smoker -- 0.50 packs/day    Types: Cigarettes  . Smokeless tobacco: Never Used  . Alcohol Use: No   OB History   Grav Para Term Preterm Abortions TAB SAB Ect Mult Living                 Review of Systems  Constitutional: Negative for fever.  HENT: Negative for rhinorrhea and sore throat.   Eyes: Negative for redness.  Respiratory: Negative for cough.   Cardiovascular: Negative for chest pain.  Gastrointestinal: Positive for nausea and vomiting. Negative for abdominal pain and diarrhea.  Genitourinary: Positive for dysuria and flank pain.  Musculoskeletal: Positive for back pain. Negative for myalgias.  Skin: Negative for rash.  Neurological: Negative for headaches.    Allergies  Review of patient's allergies indicates no known allergies.  Home Medications   Prior to Admission medications   Medication Sig Start Date End Date Taking? Authorizing Provider  ibuprofen (ADVIL,MOTRIN) 600 MG tablet Take 600 mg by mouth every 6 (six) hours as needed for moderate pain.   Yes Historical Provider, MD  oxyCODONE-acetaminophen (PERCOCET) 5-325 MG per tablet Take 1-2 tablets by mouth every 6 (six) hours as needed for severe pain. 09/05/13  Yes Mercedes Strupp Camprubi-Soms, PA-C  phenazopyridine (PYRIDIUM) 200 MG tablet Take 1 tablet (200 mg total) by mouth 3 (three) times daily as needed for pain. 09/03/13  Yes Kathi Ludwig, MD  tamsulosin (FLOMAX) 0.4 MG CAPS capsule Take 1 capsule (0.4 mg total) by mouth daily. 08/31/13  Yes Renne Crigler, PA-C  traMADol-acetaminophen (ULTRACET) 37.5-325 MG per tablet Take 1 tablet by mouth every 6 (six) hours as needed for moderate pain. 09/03/13  Yes Kathi Ludwig, MD  ondansetron (ZOFRAN) 8 MG tablet Take 1 tablet (8 mg total) by mouth every 8 (eight) hours as needed for nausea or vomiting. 09/05/13   Donnita Falls Camprubi-Soms, PA-C  polyethylene glycol (MIRALAX /  GLYCOLAX) packet Take 17 g by mouth daily as needed for moderate constipation. 09/05/13   Mercedes Strupp Camprubi-Soms, PA-C   BP 125/68  Pulse 63  Temp(Src) 97.9 F (36.6 C) (Oral)  Resp 20  SpO2 100%  LMP 08/30/2013  Physical Exam  Nursing note and vitals reviewed. Constitutional: She appears well-developed and well-nourished.  HENT:  Head: Normocephalic and atraumatic.  Eyes: Conjunctivae are normal. Right eye exhibits no discharge. Left eye exhibits no discharge.  Neck: Normal range of motion. Neck supple.  Cardiovascular: Normal rate, regular rhythm and normal heart sounds.   Pulmonary/Chest: Effort normal and breath sounds normal.  Abdominal: Soft. Bowel sounds are normal. There is CVA tenderness (left). There is no rebound and no guarding.  Neurological: She is alert.  Skin: Skin is warm and dry.  Psychiatric: She has a normal mood and affect.    ED Course  Procedures (including critical care time) Labs Review Labs Reviewed - No data to display  Imaging Review No results found.   EKG Interpretation None      3:15 PM Patient seen and examined. Work-up initiated. Medications ordered. Pt discussed with Dr. Fonnie Jarvis. No imaging indicated.   Vital signs reviewed and are as follows: BP 125/68  Pulse 63  Temp(Src) 97.9 F (36.6 C) (Oral)  Resp 20  SpO2 100%  LMP 08/30/2013  4:40 PM Non-clean catch UA. + nitrite likely false positive due to pyridium. 7-10 WBC but I expect WBC with TNTC RBCs. I will send urine culture and hold off treatment for UTI unless this is confirmed with culture.   4:47 PM Pain is improved to 7/10. Will give another dose of IV pain medication. Anticipate d/c to home with pain medication.   6:09 PM Pain is well-controlled. Pt is up in ED with no difficulties. No active vomiting.   Will d/c to home with Percocet. We discussed using Percocet for severe pain, Ultracet for mild-moderate pain. She expects to be called with f/u appointment tomorrow.    Patient urged to return with worsening symptoms or other concerns. Patient verbalized understanding and agrees with plan.    MDM   Final diagnoses:  Ureteral colic   Patient with recurrent ureteral colic s/p ureteral stent. No new symptoms. UA is not definitive for infection. Do not suspect pyelo. Suspect pain from ureteral spasm related to stent placement. Patient has reliable urology follow-up. She appears well and improved after treatment in ED.   No dangerous or life-threatening conditions suspected or identified by history, physical exam, and by work-up. No indications for hospitalization identified.      Renne Crigler, PA-C 09/08/13 [phone]

## 2013-09-08 NOTE — ED Notes (Signed)
Per EMS:18 g left AC, zofran and 100 mcg fentanyl in route. Pt c/o left flank pain, same as on 8/20.

## 2013-09-09 ENCOUNTER — Telehealth (HOSPITAL_BASED_OUTPATIENT_CLINIC_OR_DEPARTMENT_OTHER): Payer: Self-pay | Admitting: Emergency Medicine

## 2013-09-09 LAB — URINE CULTURE: Colony Count: 40000

## 2013-09-09 NOTE — Progress Notes (Signed)
ED Antimicrobial Stewardship Positive Culture Follow Up   Connie Green is an 28 y.o. female who presented to [facility] on 09/05/2013 with a chief complaint of  Chief Complaint  Patient presents with  . Flank Pain    Recent Results (from the past 720 hour(s))  MRSA PCR SCREENING     Status: None   Collection Time    09/02/13  4:23 PM      Result Value Ref Range Status   MRSA by PCR NEGATIVE  NEGATIVE Final   Comment:            The GeneXpert MRSA Assay (FDA     approved for NASAL specimens     only), is one component of a     comprehensive MRSA colonization     surveillance program. It is not     intended to diagnose MRSA     infection nor to guide or     monitor treatment for     MRSA infections.  URINE CULTURE     Status: None   Collection Time    09/05/13  5:58 PM      Result Value Ref Range Status   Specimen Description URINE, CLEAN CATCH   Final   Special Requests Normal   Final   Culture  Setup Time     Final   Value: 09/06/2013 00:40     Performed at [facility] Count     Final   Value: >=100,000 COLONIES/ML     Performed at [facility]   Culture     Final   Value: ACINETOBACTER CALCOACETICUS/BAUMANNII COMPLEX     Performed at [facility]   Report Status 09/08/2013 FINAL   Final   Organism ID, Bacteria ACINETOBACTER CALCOACETICUS/BAUMANNII COMPLEX   Final     Patient discharged originally without antimicrobial agent and treatment is now indicated  New antibiotic prescription: ciprofloxacin  PO BID x 7 days  ED Provider: Junious Silk, PA-C   [name] 09/09/2013, 10:17 AM Infectious Diseases Pharmacist Phone# [phone]

## 2013-09-09 NOTE — Telephone Encounter (Signed)
Post ED Visit - Positive Culture Follow-up: Successful Patient Follow-Up  Culture assessed and recommendations reviewed by:  Wes Dulaney, Pharm.D., BCPS  Celedonio Miyamoto, Pharm.D., BCPS  Georgina Pillion, [address].D., BCPS  [place], [address].D., BCPS, AAHIVP  Estella Husk, Pharm.D., BCPS, AAHIVP  Red Christians, Pharm.D.  Tennis Must, Pharm.D.  Positive urine culture >100,000 colonies/ml Acinetobacter   Patient discharged without antimicrobial prescription and treatment is now indicated  Organism is resistant to prescribed ED discharge antimicrobial  Patient with positive blood cultures  Changes discussed with ED provider: Konrad Dolores New antibiotic prescription Cipro  po bid x 7 days Called to   [facility] patient, date   , time  09/09/13 attempted to reach pt , not valid number  Berle Mull 09/09/2013, 10:52 AM

## 2013-09-17 NOTE — ED Provider Notes (Signed)
Medical screening examination/treatment/procedure(s) were performed by non-physician practitioner and as supervising physician I was immediately available for consultation/collaboration.   EKG Interpretation None       [name] [name], MD 09/17/13 2110 

## 2013-09-18 ENCOUNTER — Telehealth (HOSPITAL_BASED_OUTPATIENT_CLINIC_OR_DEPARTMENT_OTHER): Payer: Self-pay | Admitting: Emergency Medicine

## 2013-09-19 ENCOUNTER — Telehealth (HOSPITAL_BASED_OUTPATIENT_CLINIC_OR_DEPARTMENT_OTHER): Payer: Self-pay | Admitting: Emergency Medicine

## 2013-09-21 ENCOUNTER — Telehealth (HOSPITAL_BASED_OUTPATIENT_CLINIC_OR_DEPARTMENT_OTHER): Payer: Self-pay | Admitting: Emergency Medicine

## 2013-09-22 NOTE — Telephone Encounter (Signed)
Unable to contact patient via phone. Sent letter. °

## 2013-10-10 ENCOUNTER — Telehealth (HOSPITAL_COMMUNITY): Payer: Self-pay

## 2013-10-10 NOTE — ED Notes (Signed)
Unable to contact pt by mail or telephone. Unable to communicate lab results or treatment changes. 

## 2014-06-04 ENCOUNTER — Emergency Department (HOSPITAL_COMMUNITY): Payer: Medicaid Other

## 2014-06-04 ENCOUNTER — Encounter (HOSPITAL_COMMUNITY): Payer: Self-pay | Admitting: Emergency Medicine

## 2014-06-04 ENCOUNTER — Emergency Department (HOSPITAL_COMMUNITY)
Admission: EM | Admit: 2014-06-04 | Discharge: 2014-06-04 | Disposition: A | Discharge status: Home / Self Care | Payer: Medicaid Other | Attending: Emergency Medicine | Admitting: Emergency Medicine

## 2014-06-04 DIAGNOSIS — M722 Plantar fascial fibromatosis: Secondary | ICD-10-CM

## 2014-06-04 DIAGNOSIS — M79671 Pain in right foot: Secondary | ICD-10-CM | POA: Diagnosis present

## 2014-06-04 DIAGNOSIS — Z79899 Other long term (current) drug therapy: Secondary | ICD-10-CM | POA: Diagnosis not present

## 2014-06-04 DIAGNOSIS — Z87442 Personal history of urinary calculi: Secondary | ICD-10-CM | POA: Diagnosis not present

## 2014-06-04 DIAGNOSIS — Z720 Tobacco use: Secondary | ICD-10-CM | POA: Insufficient documentation

## 2014-06-04 MED ORDER — NAPROXEN 500 MG PO TABS
500.0000 mg | ORAL_TABLET | Freq: Once | ORAL | Status: AC
  Administered 2014-06-04: 500 mg via ORAL
  Filled 2014-06-04: qty 1

## 2014-06-04 MED ORDER — NAPROXEN 500 MG PO TABS: 500.0000 mg | ORAL_TABLET | Freq: Two times a day (BID) | ORAL | Status: DC | PRN

## 2014-06-04 NOTE — Discharge Instructions (Signed)
Freeze a water bottle, and perform stretching/icing exercises each morning upon awakening and every night before bed. Use tylenol or naprosyn as directed as needed for pain. Get supportive shoes, and change them every 6 months. You may consider getting over the counter orthotics or insoles, or you can wait to see the podiatrist who may fit you for custom orthotics. Follow up with podiatry in 1 week for recheck of symptoms and ongoing management. Return to the ER for changes or worsening symptoms.   Plantar Fasciitis Plantar fasciitis is a common condition that causes foot pain. It is soreness (inflammation) of the band of tough fibrous tissue on the bottom of the foot that runs from the heel bone (calcaneus) to the ball of the foot. The cause of this soreness may be from excessive standing, poor fitting shoes, running on hard surfaces, being overweight, having an abnormal walk, or overuse (this is common in runners) of the painful foot or feet. It is also common in aerobic exercise dancers and ballet dancers. SYMPTOMS  Most people with plantar fasciitis complain of:  Severe pain in the morning on the bottom of their foot especially when taking the first steps out of bed. This pain recedes after a few minutes of walking.  Severe pain is experienced also during walking following a long period of inactivity.  Pain is worse when walking barefoot or up stairs DIAGNOSIS   Your caregiver will diagnose this condition by examining and feeling your foot.  Special tests such as X-rays of your foot, are usually not needed. PREVENTION   Consult a sports medicine professional before beginning a new exercise program.  Walking programs offer a good workout. With walking there is a lower chance of overuse injuries common to runners. There is less impact and less jarring of the joints.  Begin all new exercise programs slowly. If problems or pain develop, decrease the amount of time or distance until you are at  a comfortable level.  Wear good shoes and replace them regularly.  Stretch your foot and the heel cords at the back of the ankle (Achilles tendon) both before and after exercise.  Run or exercise on even surfaces that are not hard. For example, asphalt is better than pavement.  Do not run barefoot on hard surfaces.  If using a treadmill, vary the incline.  Do not continue to workout if you have foot or joint problems. Seek professional help if they do not improve. HOME CARE INSTRUCTIONS   Avoid activities that cause you pain until you recover.  Use ice or cold packs on the problem or painful areas after working out.  Only take over-the-counter or prescription medicines for pain, discomfort, or fever as directed by your caregiver.  Soft shoe inserts or athletic shoes with air or gel sole cushions may be helpful.  If problems continue or become more severe, consult a sports medicine caregiver or your own health care provider. Cortisone is a potent anti-inflammatory medication that may be injected into the painful area. You can discuss this treatment with your caregiver. MAKE SURE YOU:   Understand these instructions.  Will watch your condition.  Will get help right away if you are not doing well or get worse. Document Released: 09/28/2000 Document Revised: 03/28/2011 Document Reviewed: 11/28/2007 [facility] Patient Information 2015 [place], [place]. This information is not intended to replace advice given to you by your health care provider. Make sure you discuss any questions you have with your health care provider.

## 2014-06-04 NOTE — ED Provider Notes (Signed)
CSN: [id number]     Arrival date & time 06/04/14  4098 History   First MD Initiated Contact with Patient 06/04/14 [phone]     Chief Complaint  Patient presents with  . Foot Pain     (Consider location/radiation/quality/duration/timing/severity/associated sxs/prior Treatment) HPI Comments: Connie Green is a 29 y.o. female with a PMHx of nephrolithiasis, who presents to the ED with complaints of right foot pain 4 months. She describes the pain as 10/10, constant, throbbing, located to the plantar aspect of the arch of her foot close to the heel, nonradiating, worse in the morning when she awakens, and worse with prolonged periods of standing, and unrelieved with ice and heat soaks, NSAIDs, and Tylenol. 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 She denies any swelling, skin changes, erythema or warmth, numbness, tingling, or weakness. Denies any fevers or chills. She states that she has had prior ankle sprains to this ankle, but no injuries to the foot or recent trauma to the ankle.   Patient is a 29 y.o. female presenting with lower extremity pain. The history is provided by the patient. No language interpreter was used.  Foot Pain This is a new problem. The current episode started more than 1 month ago. The problem occurs constantly. The problem has been unchanged. Associated symptoms include arthralgias (R foot). Pertinent negatives include no chills, fever, joint swelling, myalgias, numbness or weakness. The symptoms are aggravated by standing. She has tried ice, heat, NSAIDs and acetaminophen for the symptoms. The treatment provided no relief.    Past Medical History  Diagnosis Date  . Kidney stones    Past Surgical History  Procedure Laterality Date  . Lithotripsy    . Bladder repair w/ cesarean section      x4  .  Wisdom tooth extraction    . Cystoscopy/retrograde/ureteroscopy Left 09/02/2013    Procedure: CYSTOSCOPY/RETROGRADE/LEFT URETEROSCOPY WITH BACKSTOP, BASKET EXTRACTION AND HOLMIUM LASER AND PlACEMENT OF LEFT STENT;  Surgeon: Kathi Ludwig, MD;  Location: [facility];  Service: Urology;  Laterality: Left;   No family history on file. History  Substance Use Topics  . Smoking status: Current Every Day Smoker -- 0.50 packs/day    Types: Cigarettes  . Smokeless tobacco: Never Used  . Alcohol Use: No   OB History    No data available     Review of Systems  Constitutional: Negative for fever and chills.  Cardiovascular: Negative for leg swelling.  Musculoskeletal: Positive for arthralgias (R foot). Negative for myalgias, back pain and joint swelling.  Skin: Negative for color change.  Neurological: Negative for weakness and numbness.   10 Systems reviewed and are negative for acute change except as noted in the HPI.    Allergies  Review of patient's allergies indicates no known allergies.  Home Medications   Prior to Admission medications   Medication Sig Start Date End Date Taking? Authorizing Provider  ibuprofen (ADVIL,MOTRIN) 600 MG tablet Take 600 mg by mouth every 6 (six) hours as needed for moderate pain.    Historical Provider, MD  ondansetron (ZOFRAN) 8 MG tablet Take 1 tablet (8 mg total) by mouth every 8 (eight) hours as needed for nausea or vomiting. 09/05/13   Tameka Hoiland Camprubi-Soms, PA-C  oxyCODONE-acetaminophen (PERCOCET/ROXICET) 5-325 MG per tablet Take 1-2 tablets by mouth every 6 (six)  hours as needed for severe pain. 09/08/13   Renne CriglerJoshua Geiple, PA-C  phenazopyridine (PYRIDIUM) 200 MG tablet Take 1 tablet (200 mg total) by mouth 3 (three) times daily as needed for pain. 09/03/13   Jethro BolusSigmund Tannenbaum, MD  polyethylene glycol [facility](MIRALAX / Ethelene HalGLYCOLAX) packet Take 17 g by mouth daily as needed for moderate constipation. 09/05/13   Riggs Dineen Camprubi-Soms, PA-C  tamsulosin (FLOMAX)  0.4 MG CAPS capsule Take 1 capsule (0.4 mg total) by mouth daily. 08/31/13   Renne CriglerJoshua Geiple, PA-C  traMADol-acetaminophen (ULTRACET) 37.5-325 MG per tablet Take 1 tablet by mouth every 6 (six) hours as needed for moderate pain. 09/03/13   Jethro BolusSigmund Tannenbaum, MD   BP 124/69 mmHg  Pulse 81  Temp(Src) 98.2 F (36.8 C) (Oral)  Resp 18  SpO2 100% Physical Exam  Constitutional: She is oriented to person, place, and time. Vital signs are normal. She appears well-developed and well-nourished.  Non-toxic appearance. No distress.  Afebrile, nontoxic, NAD  HENT:  Head: Normocephalic and atraumatic.  Mouth/Throat: Mucous membranes are normal.  Eyes: Conjunctivae and EOM are normal. Right eye exhibits no discharge. Left eye exhibits no discharge.  Neck: Normal range of motion. Neck supple.  Cardiovascular: Normal rate and intact distal pulses.   Pulmonary/Chest: Effort normal. No respiratory distress.  Abdominal: Normal appearance. She exhibits no distension.  Musculoskeletal: Normal range of motion.       Right foot: There is tenderness. There is normal range of motion, no bony tenderness, no swelling, normal capillary refill, no crepitus and no deformity.       Feet:  R foot and ankle with FROM intact, wiggles all digits, with TTP along calcaneus at the insertion site of plantar fascia, no swelling or skin changes, no deformity, no forefoot or ankle bony TTP, cap refill brisk and present, strength and sensation grossly intact, distal pulses intact. No calf tenderness or swelling.  Neurological: She is alert and oriented to person, place, and time. She has normal strength. No sensory deficit.  Skin: Skin is warm, dry and intact. No rash noted.  Psychiatric: She has a normal mood and affect. Her behavior is normal.  Nursing note and vitals reviewed.   ED Course  Procedures (including critical care time) Labs Review Labs Reviewed - No data to display  Imaging Review No results found.   EKG  Interpretation None      MDM   Final diagnoses:  Plantar fasciitis of right foot    29 y.o. female here with plantar fascia insertion site pain x4 months. Works as a Child psychotherapistwaitress, on her feet a lot. On exam, no skin changes, neurovascularly intact with soft compartments, FROM intact, with tenderness at the calcaneus insertion site of plantar fascia. No injury, doubt need for xrays, will cancel the ones ordered in triage. Discussed frozen bottle stretches and NSAID therapy. Discussed proper shoes and orthotics. Will have her f/up with podiatry. I explained the diagnosis and have given explicit precautions to return to the ER including for any other new or worsening symptoms. The patient understands and accepts the medical plan as it's been dictated and I have answered their questions. Discharge instructions concerning home care and prescriptions have been given. The patient is STABLE and is discharged to home in good condition.   BP 124/69 mmHg  Pulse 81  Temp(Src) 98.2 F (36.8 C) (Oral)  Resp 18  SpO2 100%  Meds ordered this encounter  Medications  . naproxen (NAPROSYN) tablet 500 mg    Sig:   .  naproxen (NAPROSYN) 500 MG tablet    Sig: Take 1 tablet (500 mg total) by mouth 2 (two) times daily as needed for mild pain or moderate pain (TAKE WITH MEALS.).    Dispense:  20 tablet    Refill:  0    Order Specific Question:  Supervising Provider    Answer:  Eber HongMILLER, BRIAN [[id number]]     Meris Reede Camprubi-Soms, PA-C 06/04/14 0831  Gerhard Munchobert Lockwood, MD 06/04/14 1535

## 2014-06-04 NOTE — ED Notes (Signed)
Pt c/o rt foot pain since January.  States that she "can't take it anymore".  Denies injury to cause pain.

## 2015-08-22 ENCOUNTER — Encounter (HOSPITAL_COMMUNITY): Payer: Self-pay | Admitting: Emergency Medicine

## 2015-08-22 ENCOUNTER — Emergency Department (HOSPITAL_COMMUNITY)
Admission: EM | Admit: 2015-08-22 | Discharge: 2015-08-23 | Disposition: A | Discharge status: Home / Self Care | Payer: Medicaid Other | Attending: Dermatology | Admitting: Dermatology

## 2015-08-22 DIAGNOSIS — R109 Unspecified abdominal pain: Secondary | ICD-10-CM | POA: Diagnosis not present

## 2015-08-22 DIAGNOSIS — Z5321 Procedure and treatment not carried out due to patient leaving prior to being seen by health care provider: Secondary | ICD-10-CM | POA: Diagnosis not present

## 2015-08-22 NOTE — ED Triage Notes (Signed)
Patient presents for right flank pain, urinary frequency, dysuria x1 day. Denies fever, N/V.

## 2015-08-22 NOTE — ED Notes (Signed)
No answer when pt's name called in the waiting room 

## 2015-11-04 IMAGING — DX DG ABDOMEN 1V
1 series · 1 of 1 positions shown · non-contrast
Comparison: CT abdomen and pelvis August 31, 2013

CLINICAL DATA: Nephrocalcinosis

EXAM:
ABDOMEN - 1 VIEW

[abdomen kub]
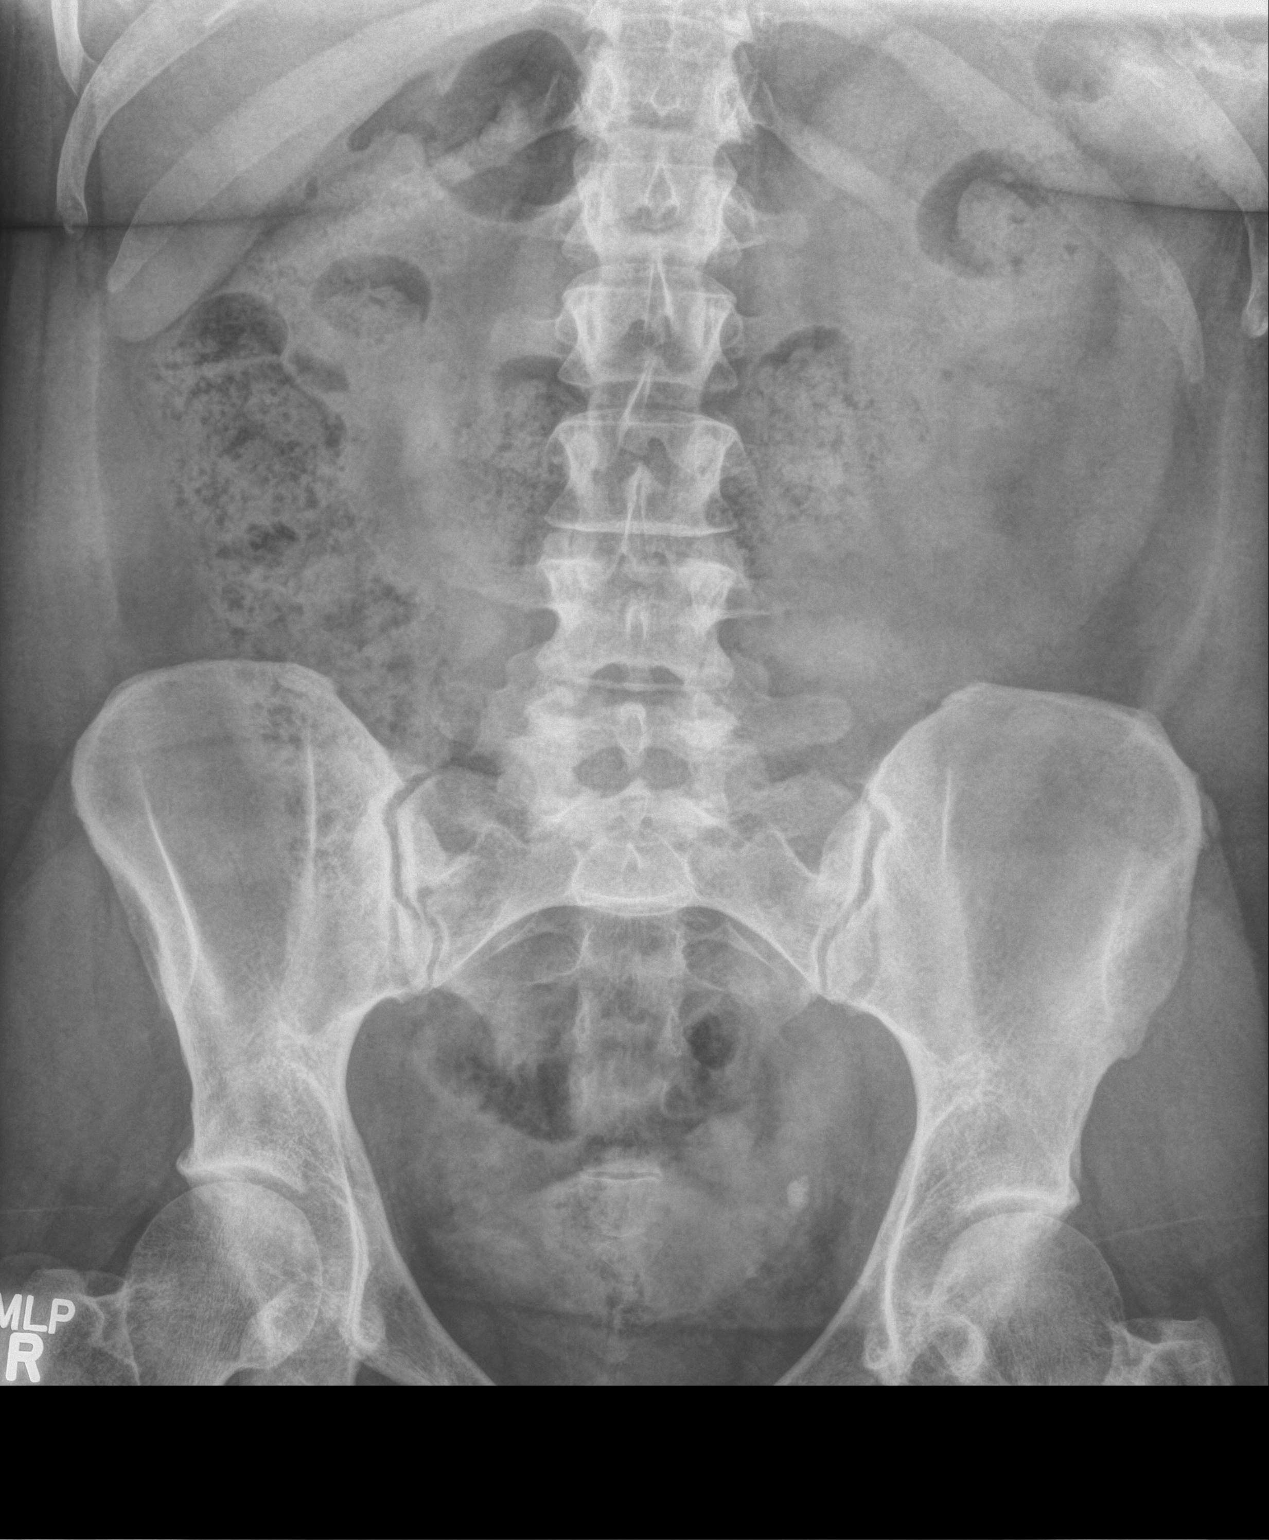

[1 of 1 positions shown; findings below may reference images not displayed]

FINDINGS: There is a focal calcification in the left pelvis measuring 9 by 6
mm suspicious for calculus. A second calcification more distally in
the left pelvis measures 5 x 4 mm, possibly a second ureteral
calculus. No other abnormal calcifications are identified. Note that
there is extensive stool obscuring the kidneys. Bowel gas pattern is
unremarkable. No obstruction or free air is seen on this supine
examination.
IMPRESSION: Suspect persistent distal left ureteral calculi. Bowel gas pattern
unremarkable. Diffuse stool throughout colon. Note that stool
obscures the kidneys and precludes evaluation for potential
intrarenal calculi.

## 2016-09-05 ENCOUNTER — Encounter: Payer: Self-pay | Admitting: Emergency Medicine

## 2016-09-05 ENCOUNTER — Emergency Department
Admission: EM | Admit: 2016-09-05 | Discharge: 2016-09-05 | Disposition: A | Discharge status: Home / Self Care | Payer: Self-pay | Attending: Emergency Medicine | Admitting: Emergency Medicine

## 2016-09-05 ENCOUNTER — Emergency Department: Payer: Self-pay

## 2016-09-05 DIAGNOSIS — J069 Acute upper respiratory infection, unspecified: Secondary | ICD-10-CM | POA: Insufficient documentation

## 2016-09-05 DIAGNOSIS — F17210 Nicotine dependence, cigarettes, uncomplicated: Secondary | ICD-10-CM | POA: Insufficient documentation

## 2016-09-05 DIAGNOSIS — M722 Plantar fascial fibromatosis: Secondary | ICD-10-CM | POA: Insufficient documentation

## 2016-09-05 DIAGNOSIS — Z79899 Other long term (current) drug therapy: Secondary | ICD-10-CM | POA: Insufficient documentation

## 2016-09-05 MED ORDER — NAPROXEN 500 MG PO TABS: 500.0000 mg | ORAL_TABLET | Freq: Two times a day (BID) | ORAL | 0 refills | Status: DC | PRN

## 2016-09-05 MED ORDER — PSEUDOEPH-BROMPHEN-DM 30-2-10 MG/5ML PO SYRP: 5.0000 mL | ORAL_SOLUTION | Freq: Four times a day (QID) | ORAL | 0 refills | Status: DC | PRN

## 2016-09-05 NOTE — ED Triage Notes (Signed)
States bilateral foot pain x 1 month. Denies injury but states stands on feet at work. Also states cough since yesterday.

## 2016-09-05 NOTE — ED Triage Notes (Signed)
FIRST NURSE NOTE-pt ambulatory to triage. NAD. Unlabored respirations, c/o feet swelling

## 2016-09-05 NOTE — Discharge Instructions (Signed)
Begin Taking  naproxen 500 mg twice a day with food. Obtain orthotics to insert issue or wear supportive shoes while at work. Use ice at night as we discussed. If not improving follow up with Dr. Ether Griffins at [facility] who is the podiatrist on-call. Bromfed DM as needed for cough and congestion. Increase fluids and decrease smoking.

## 2016-09-05 NOTE — ED Notes (Signed)
Pt alert and oriented X4, active, cooperative, pt in NAD. RR even and unlabored, color WNL.  Pt informed to return if any life threatening symptoms occur.   

## 2016-09-05 NOTE — ED Notes (Signed)
See triage note  Presents with bilateral foot pain for about 1 month   States pain is worse when she goes from sitting to standing   Denies any injury.  Also developed dry cough yesterday  No fever

## 2016-09-05 NOTE — ED Provider Notes (Signed)
[facility] Emergency Department Provider Note  ____________________________________________   First MD Initiated Contact with Patient 09/05/16 1206     (approximate)  I have reviewed the triage vital signs and the nursing notes.   HISTORY  Chief Complaint Foot Pain and Cough   HPI Connie Green is a 31 y.o. female is here with complaint of bilateral foot pain for 1 month. Patient states that she stands mostly at work. She states that first thing every morning it is very difficult for her to get out of bed and stand and also after she has been sitting for this a period of time. She is also here to have a cough that she started yesterday evaluated. Patient does smoke. She has a history of bronchitis in the past. She is unaware of any fever or chills at home. She rates her pain as a 10 over 10.   Past Medical History:  Diagnosis Date  . Kidney stones     Patient Active Problem List   Diagnosis Date Noted  . Kidney stone 09/02/2013  . Left ureteral stone 09/02/2013    Past Surgical History:  Procedure Laterality Date  . BLADDER REPAIR W/ CESAREAN SECTION     x4  . CYSTOSCOPY/RETROGRADE/URETEROSCOPY Left 09/02/2013   Procedure: CYSTOSCOPY/RETROGRADE/LEFT URETEROSCOPY WITH BACKSTOP, BASKET EXTRACTION AND HOLMIUM LASER AND PlACEMENT OF LEFT STENT;  Surgeon: Kathi Ludwig, MD;  Location: [facility];  Service: Urology;  Laterality: Left;  . LITHOTRIPSY    . WISDOM TOOTH EXTRACTION      Prior to Admission medications   Medication Sig Start Date End Date Taking? Authorizing Provider  acetaminophen (TYLENOL) 500 MG tablet Take 1,000 mg by mouth every 6 (six) hours as needed for moderate pain or headache.    [provider]  brompheniramine-pseudoephedrine-DM 30-2-10 MG/5ML syrup Take 5 mLs by mouth 4 (four) times daily as needed. 09/05/16   Tommi Rumps, PA-C  naproxen (NAPROSYN) 500 MG tablet Take 1 tablet (500 mg total) by mouth 2  (two) times daily as needed for mild pain or moderate pain (TAKE WITH MEALS.). 09/05/16   Tommi Rumps, PA-C    Allergies Patient has no known allergies.  No family history on file.  Social History Social History  Substance Use Topics  . Smoking status: Current Every Day Smoker    Packs/day: 0.50    Types: Cigarettes  . Smokeless tobacco: Never Used  . Alcohol use No    Review of Systems Constitutional: No fever/chills Cardiovascular: Denies chest pain. Respiratory: Denies shortness of breath. Positive for cough. Gastrointestinal:   No nausea, no vomiting.  Musculoskeletal: Positive for bilateral feet pain. Skin: Negative for rash. Neurological: Negative for headaches, focal weakness or numbness. ____________________________________________   PHYSICAL EXAM:  VITAL SIGNS: ED Triage Vitals  Enc Vitals Group     BP 09/05/16 1120 126/77     Pulse Rate 09/05/16 1120 76     Resp 09/05/16 1120 20     Temp 09/05/16 1120 99 F (37.2 C)     Temp Source 09/05/16 1120 Oral     SpO2 09/05/16 1120 99 %     Weight 09/05/16 1121 240 lb (108.9 kg)     Height 09/05/16 1121 5\' 7"  (1.702 m)     Head Circumference --      Peak Flow --      Pain Score 09/05/16 1120 10     Pain Loc --      Pain Edu? --  Excl. in GC? --     Constitutional: Alert and oriented. Well appearing and in no acute distress. Eyes: Conjunctivae are normal.  Head: Atraumatic. Nose: Mild congestion/rhinnorhea. EACs and TMs are clear. Neck: No stridor.  Cardiovascular: Normal rate, regular rhythm. Grossly normal heart sounds.  Good peripheral circulation. Respiratory: Normal respiratory effort.  No retractions. Lungs CTAB. Musculoskeletal: Examination of the feet bilaterally there is no gross deformity however there is moderate tenderness on palpation especially on the right plantar aspect and heel. Range of motion is without restriction. Patient does have some difficulty getting from sitting to standing  position but is able to walk after she has stood for period time. Skin is intact. Capillary refill is less than 3 seconds motor sensory function intact. Neurologic:  Normal speech and language. No gross focal neurologic deficits are appreciated. No gait instability. Skin:  Skin is warm, dry and intact. No rash noted. Psychiatric: Mood and affect are normal. Speech and behavior are normal.  ____________________________________________   LABS (all labs ordered are listed, but only abnormal results are displayed)  Labs Reviewed - No data to display  RADIOLOGY  Dg Foot Complete Right  Result Date: 09/05/2016 CLINICAL DATA:  Bilateral foot pain for 1 month, worse on the right. No acute injury. EXAM: RIGHT FOOT COMPLETE - 3+ VIEW COMPARISON:  None. FINDINGS: The mineralization and alignment are normal. There is no evidence of acute fracture or dislocation. The joint spaces are maintained. There is a small plantar calcaneal spur. The soft tissues appear unremarkable. IMPRESSION: No acute findings.  Small plantar calcaneal spur. Electronically Signed   By: Carey Bullocks M.D.   On: 09/05/2016 13:02    ____________________________________________   PROCEDURES  Procedure(s) performed: None  Procedures  Critical Care performed: No  ____________________________________________   INITIAL IMPRESSION / ASSESSMENT AND PLAN / ED COURSE  Pertinent labs & imaging results that were available during my care of the patient were reviewed by me and considered in my medical decision making (see chart for details).  Patient was made aware of her x-ray results. We discussed treatments for her plantar fasciitis. She will also look for orthotics at [facility]. If anti-inflammatory and orthotics are not helping she will make an appointment in follow-up with the podiatrist at [facility] if any continued problems. She is given a prescription for naproxen 500 mg twice a day with food. She is also given a  prescription for Bromfed-DM as needed for cough and congestion. She is encouraged to increase fluids and decrease smoking.  ____________________________________________   FINAL CLINICAL IMPRESSION(S) / ED DIAGNOSES  Final diagnoses:  Plantar fasciitis, bilateral  Upper respiratory tract infection, unspecified type  Cigarette smoker      NEW MEDICATIONS STARTED DURING THIS VISIT:  Discharge Medication List as of 09/05/2016  1:38 PM    START taking these medications   Details  brompheniramine-pseudoephedrine-DM 30-2-10 MG/5ML syrup Take 5 mLs by mouth 4 (four) times daily as needed., Starting Mon 09/05/2016, Print         Note:  This document was prepared using Dragon voice recognition software and may include unintentional dictation errors.    Tommi Rumps, PA-C 09/05/16 1445    Merrily Brittle, MD 09/05/16 1501

## 2017-05-03 ENCOUNTER — Emergency Department
Admission: EM | Admit: 2017-05-03 | Discharge: 2017-05-03 | Disposition: A | Discharge status: Home / Self Care | Payer: Self-pay | Attending: Emergency Medicine | Admitting: Emergency Medicine

## 2017-05-03 ENCOUNTER — Encounter: Payer: Self-pay | Admitting: Emergency Medicine

## 2017-05-03 ENCOUNTER — Emergency Department: Payer: Self-pay

## 2017-05-03 DIAGNOSIS — R112 Nausea with vomiting, unspecified: Secondary | ICD-10-CM | POA: Insufficient documentation

## 2017-05-03 DIAGNOSIS — R197 Diarrhea, unspecified: Secondary | ICD-10-CM | POA: Insufficient documentation

## 2017-05-03 DIAGNOSIS — Z3202 Encounter for pregnancy test, result negative: Secondary | ICD-10-CM | POA: Insufficient documentation

## 2017-05-03 DIAGNOSIS — J020 Streptococcal pharyngitis: Secondary | ICD-10-CM | POA: Insufficient documentation

## 2017-05-03 DIAGNOSIS — R1030 Lower abdominal pain, unspecified: Secondary | ICD-10-CM | POA: Insufficient documentation

## 2017-05-03 DIAGNOSIS — F17210 Nicotine dependence, cigarettes, uncomplicated: Secondary | ICD-10-CM | POA: Insufficient documentation

## 2017-05-03 LAB — URINALYSIS, COMPLETE (UACMP) WITH MICROSCOPIC
BILIRUBIN URINE: NEGATIVE
Bacteria, UA: NONE SEEN
GLUCOSE, UA: NEGATIVE mg/dL
Ketones, ur: 20 mg/dL — AB
LEUKOCYTES UA: NEGATIVE
NITRITE: NEGATIVE
PH: 6 (ref 5.0–8.0)
Protein, ur: 100 mg/dL — AB
SPECIFIC GRAVITY, URINE: 1.027 (ref 1.005–1.030)

## 2017-05-03 LAB — CBC
HCT: 45.7 % (ref 35.0–47.0)
Hemoglobin: 15.8 g/dL (ref 12.0–16.0)
MCH: 30.9 pg (ref 26.0–34.0)
MCHC: 34.5 g/dL (ref 32.0–36.0)
MCV: 89.6 fL (ref 80.0–100.0)
PLATELETS: 247 10*3/uL (ref 150–440)
RBC: 5.1 MIL/uL (ref 3.80–5.20)
RDW: 13.2 % (ref 11.5–14.5)
WBC: 20.4 10*3/uL — AB (ref 3.6–11.0)

## 2017-05-03 LAB — COMPREHENSIVE METABOLIC PANEL
ALK PHOS: 95 U/L (ref 38–126)
ALT: 74 U/L — AB (ref 14–54)
AST: 34 U/L (ref 15–41)
Albumin: 4.3 g/dL (ref 3.5–5.0)
Anion gap: 8 (ref 5–15)
BILIRUBIN TOTAL: 0.6 mg/dL (ref 0.3–1.2)
BUN: 8 mg/dL (ref 6–20)
CALCIUM: 9.2 mg/dL (ref 8.9–10.3)
CO2: 26 mmol/L (ref 22–32)
CREATININE: 0.71 mg/dL (ref 0.44–1.00)
Chloride: 101 mmol/L (ref 101–111)
GFR calc Af Amer: >60 mL/min (ref >60)
Glucose, Bld: 116 mg/dL — ABNORMAL HIGH (ref 65–99)
Potassium: 3.9 mmol/L (ref 3.5–5.1)
Sodium: 135 mmol/L (ref 135–145)
TOTAL PROTEIN: 8.5 g/dL — AB (ref 6.5–8.1)

## 2017-05-03 LAB — LIPASE, BLOOD: Lipase: 20 U/L (ref 11–51)

## 2017-05-03 LAB — INFLUENZA PANEL BY PCR (TYPE A & B)
INFLAPCR: NEGATIVE
Influenza B By PCR: NEGATIVE

## 2017-05-03 LAB — POCT PREGNANCY, URINE: Preg Test, Ur: NEGATIVE

## 2017-05-03 LAB — GROUP A STREP BY PCR: Group A Strep by PCR: DETECTED — AB

## 2017-05-03 MED ORDER — ONDANSETRON HCL 4 MG/2ML IJ SOLN
4.0000 mg | Freq: Once | INTRAMUSCULAR | Status: AC | PRN
  Administered 2017-05-03: 4 mg via INTRAVENOUS
  Filled 2017-05-03: qty 2

## 2017-05-03 MED ORDER — PENICILLIN G BENZATHINE 1200000 UNIT/2ML IM SUSP: 1.2000 10*6.[IU] | Freq: Once | INTRAMUSCULAR | Status: DC

## 2017-05-03 MED ORDER — PENICILLIN G BENZATHINE & PROC 1200000 UNIT/2ML IM SUSP
1.2000 10*6.[IU] | Freq: Once | INTRAMUSCULAR | Status: AC
  Administered 2017-05-03: 1.2 10*6.[IU] via INTRAMUSCULAR
  Filled 2017-05-03: qty 2

## 2017-05-03 MED ORDER — MORPHINE SULFATE (PF) 4 MG/ML IV SOLN
4.0000 mg | Freq: Once | INTRAVENOUS | Status: AC
  Administered 2017-05-03: 4 mg via INTRAVENOUS
  Filled 2017-05-03: qty 1

## 2017-05-03 MED ORDER — ONDANSETRON HCL 4 MG PO TABS: 4.0000 mg | ORAL_TABLET | Freq: Every day | ORAL | 0 refills | Status: DC | PRN

## 2017-05-03 MED ORDER — SODIUM CHLORIDE 0.9 % IV BOLUS
1000.0000 mL | Freq: Once | INTRAVENOUS | Status: AC
  Administered 2017-05-03: 1000 mL via INTRAVENOUS

## 2017-05-03 MED ORDER — IOHEXOL 300 MG/ML  SOLN
100.0000 mL | Freq: Once | INTRAMUSCULAR | Status: AC | PRN
  Administered 2017-05-03: 100 mL via INTRAVENOUS

## 2017-05-03 NOTE — ED Notes (Signed)
Pt returned from CT °

## 2017-05-03 NOTE — ED Triage Notes (Addendum)
FIRST NURSE NOTE- "I am dying, no seriously I am". C/o headache, neck pain, vomiting, lower back pain,  "kidney pain".  Ambulatory. NAD. Unlabored, no vomiting at check in

## 2017-05-03 NOTE — ED Provider Notes (Addendum)
[facility] Emergency Department Provider Note  ___________________________________________   First MD Initiated Contact with Patient 05/03/17 2039     (approximate)  I have reviewed the triage vital signs and the nursing notes.   HISTORY  Chief Complaint Emesis and Diarrhea   HPI Connie Green is a 32 y.o. female with history of kidney stones was brought in with 3 days of nausea, vomiting diarrhea, throat pain, chills and subjective fever.  She says that she is having 8 out of 10 pain which is sharp to her bilateral "kidneys."  However, she denies any burning with urination.  Says that she is also had multiple episodes of vomiting and diarrhea.  No sick contacts.  No blood in the stool.  Says that she is also having throat pain and right-sided neck pain that hurts when she looks to the right.  Says that she has very mild abdominal cramping intermittently but is not having any abdominal pain at this time.  Past Medical History:  Diagnosis Date  . Kidney stones     Patient Active Problem List   Diagnosis Date Noted  . Kidney stone 09/02/2013  . Left ureteral stone 09/02/2013    Past Surgical History:  Procedure Laterality Date  . BLADDER REPAIR W/ CESAREAN SECTION     x4  . CYSTOSCOPY/RETROGRADE/URETEROSCOPY Left 09/02/2013   Procedure: CYSTOSCOPY/RETROGRADE/LEFT URETEROSCOPY WITH BACKSTOP, BASKET EXTRACTION AND HOLMIUM LASER AND PlACEMENT OF LEFT STENT;  Surgeon: Kathi Ludwig, MD;  Location: [facility];  Service: Urology;  Laterality: Left;  . LITHOTRIPSY    . WISDOM TOOTH EXTRACTION      Prior to Admission medications   Medication Sig Start Date End Date Taking? Authorizing Provider  acetaminophen (TYLENOL) 500 MG tablet Take 1,000 mg by mouth every 6 (six) hours as needed for moderate pain or headache.   Yes [provider]  brompheniramine-pseudoephedrine-DM 30-2-10 MG/5ML syrup Take 5 mLs by mouth 4 (four) times daily as  needed. Patient not taking: Reported on 05/03/2017 09/05/16   Tommi Rumps, PA-C  naproxen (NAPROSYN) 500 MG tablet Take 1 tablet (500 mg total) by mouth 2 (two) times daily as needed for mild pain or moderate pain (TAKE WITH MEALS.). Patient not taking: Reported on 05/03/2017 09/05/16   Tommi Rumps, PA-C    Allergies Patient has no known allergies.  No family history on file.  Social History Social History   Tobacco Use  . Smoking status: Current Every Day Smoker    Packs/day: 0.50    Types: Cigarettes  . Smokeless tobacco: Never Used  Substance Use Topics  . Alcohol use: No  . Drug use: No    Review of Systems  Constitutional: Fever Eyes: No visual changes. ENT: As above Cardiovascular: Denies chest pain. Respiratory: Denies shortness of breath. Gastrointestinal:   No constipation. Genitourinary: Negative for dysuria. Musculoskeletal: As above Skin: Negative for rash. Neurological: Negative for headaches, focal weakness or numbness.   ____________________________________________   PHYSICAL EXAM:  VITAL SIGNS: ED Triage Vitals  Enc Vitals Group     BP 05/03/17 1812 131/85     Pulse Rate 05/03/17 1812 (!) 117     Resp 05/03/17 1812 20     Temp 05/03/17 1812 98.7 F (37.1 C)     Temp Source 05/03/17 1812 Oral     SpO2 05/03/17 1812 97 %     Weight 05/03/17 1814 240 lb (108.9 kg)     Height 05/03/17 1814 5\' 7"  (1.702 m)  Head Circumference --      Peak Flow --      Pain Score 05/03/17 1813 9     Pain Loc --      Pain Edu? --      Excl. in GC? --     Constitutional: Alert and oriented. Well appearing and in no acute distress. Eyes: Conjunctivae are normal.  Head: Atraumatic. Nose: No congestion/rhinnorhea. Mouth/Throat: Mucous membranes are moist.  Erythematous pharynx with exudate over the left tonsil.  No tonsillar swelling. Neck: No stridor.  Mild tenderness to palpation over the right anterior cervical lymph nodes which are also mild to  moderately swollen.  No meningismus.  Rating her neck freely. Cardiovascular: Tachycardic, regular rhythm. Grossly normal heart sounds.  Good peripheral circulation. Respiratory: Normal respiratory effort.  No retractions. Lungs CTAB. Gastrointestinal: Soft and nontender. No distention.  Bilateral moderate CVA tenderness to palpation. Musculoskeletal: No lower extremity tenderness nor edema.  No joint effusions. Neurologic:  Normal speech and language. No gross focal neurologic deficits are appreciated. Skin:  Skin is warm, dry and intact. No rash noted. Psychiatric: Mood and affect are normal. Speech and behavior are normal.  ____________________________________________   LABS (all labs ordered are listed, but only abnormal results are displayed)  Labs Reviewed  GROUP A STREP BY PCR - Abnormal; Notable for the following components:      Result Value   Group A Strep by PCR DETECTED (*)    All other components within normal limits  COMPREHENSIVE METABOLIC PANEL - Abnormal; Notable for the following components:   Glucose, Bld 116 (*)    Total Protein 8.5 (*)    ALT 74 (*)    All other components within normal limits  CBC - Abnormal; Notable for the following components:   WBC 20.4 (*)    All other components within normal limits  URINALYSIS, COMPLETE (UACMP) WITH MICROSCOPIC - Abnormal; Notable for the following components:   Color, Urine AMBER (*)    APPearance CLOUDY (*)    Hgb urine dipstick SMALL (*)    Ketones, ur 20 (*)    Protein, ur 100 (*)    Squamous Epithelial / LPF TOO NUMEROUS TO COUNT (*)    All other components within normal limits  URINE CULTURE  LIPASE, BLOOD  INFLUENZA PANEL BY PCR (TYPE A & B)  POC URINE PREG, ED  POCT PREGNANCY, URINE   ____________________________________________  EKG   ____________________________________________  RADIOLOGY  No acute finding on the CAT scan of the abdomen and  pelvis ____________________________________________   PROCEDURES  Procedure(s) performed:   Procedures  Critical Care performed:   ____________________________________________   INITIAL IMPRESSION / ASSESSMENT AND PLAN / ED COURSE  Pertinent labs & imaging results that were available during my care of the patient were reviewed by me and considered in my medical decision making (see chart for details).  DDX: Strep pharyngitis, influenza, viral syndrome, appendicitis, kidney stone, pyelonephritis, nausea vomiting, enteritis As part of my medical decision making, I reviewed the following data within the electronic MEDICAL RECORD NUMBER Notes from prior ED visits  ----------------------------------------- 10:54 PM on 05/03/2017 -----------------------------------------  Patient at this time resting comfortably.  Heart rate of 96 bpm.  Temperature of 98.5 at this time.  Positive for group A strep.  Reassuring CAT scan.  Likely systemic reaction from the group A strep.  Patient would like the penicillin shot.  She is also able to tolerate p.o. fluids at this time.  However, will discharge home with  Zofran.  Patient is understanding of the treatment plan as well as need for follow-up as an outpatient and willing to comply.  Flu still pending at this time.  However, with the patient being 3 days out from the B onset of her symptoms the efficacy of Tamiflu at this time would be minimal.  Also has a positive strep test which also excludes her symptoms. ____________________________________________   FINAL CLINICAL IMPRESSION(S) / ED DIAGNOSES  Strep pharyngitis.  Nausea and vomiting.    NEW MEDICATIONS STARTED DURING THIS VISIT:  New Prescriptions   No medications on file     Note:  This document was prepared using Dragon voice recognition software and may include unintentional dictation errors.     Myrna BlazerSchaevitz, David Matthew, MD 05/03/17 [id number]    Myrna BlazerSchaevitz, David Matthew,  MD 05/03/17 [id number]

## 2017-05-03 NOTE — ED Notes (Signed)
Pt c/o body aches, neck pain, N&V&D, cough, congestion. Alert, oriented, ambulatory. No distress noted.

## 2017-05-03 NOTE — ED Triage Notes (Addendum)
Pt arrived with complaints of n/v/d for the last 3 days. Pt also reports lower abdominal pain that started Monday. Pt states she has been unable to keep anything down.

## 2017-05-03 NOTE — ED Notes (Signed)
Dr. Schaevitz at bedside.  

## 2017-05-03 NOTE — ED Notes (Signed)
Pt ambulatory to room , no distress noted

## 2017-05-05 LAB — URINE CULTURE

## 2018-09-29 ENCOUNTER — Other Ambulatory Visit: Payer: Self-pay

## 2018-09-29 ENCOUNTER — Encounter: Payer: Self-pay | Admitting: Emergency Medicine

## 2018-09-29 ENCOUNTER — Emergency Department
Admission: EM | Admit: 2018-09-29 | Discharge: 2018-09-29 | Disposition: A | Discharge status: Home / Self Care | Payer: Medicaid Other | Attending: Emergency Medicine | Admitting: Emergency Medicine

## 2018-09-29 ENCOUNTER — Emergency Department: Payer: Medicaid Other

## 2018-09-29 DIAGNOSIS — F17210 Nicotine dependence, cigarettes, uncomplicated: Secondary | ICD-10-CM | POA: Diagnosis not present

## 2018-09-29 DIAGNOSIS — M5440 Lumbago with sciatica, unspecified side: Secondary | ICD-10-CM | POA: Diagnosis not present

## 2018-09-29 DIAGNOSIS — M5136 Other intervertebral disc degeneration, lumbar region: Secondary | ICD-10-CM | POA: Insufficient documentation

## 2018-09-29 DIAGNOSIS — M545 Low back pain: Secondary | ICD-10-CM | POA: Diagnosis present

## 2018-09-29 LAB — POCT PREGNANCY, URINE: Preg Test, Ur: NEGATIVE

## 2018-09-29 MED ORDER — ORPHENADRINE CITRATE 30 MG/ML IJ SOLN
60.0000 mg | Freq: Once | INTRAMUSCULAR | Status: AC
  Administered 2018-09-29: 14:00:00 60 mg via INTRAMUSCULAR
  Filled 2018-09-29: qty 2

## 2018-09-29 MED ORDER — KETOROLAC TROMETHAMINE 30 MG/ML IJ SOLN
30.0000 mg | Freq: Once | INTRAMUSCULAR | Status: AC
  Administered 2018-09-29: 30 mg via INTRAMUSCULAR
  Filled 2018-09-29: qty 1

## 2018-09-29 MED ORDER — PREDNISONE 10 MG (21) PO TBPK: ORAL_TABLET | ORAL | 0 refills | Status: AC

## 2018-09-29 MED ORDER — HYDROCODONE-ACETAMINOPHEN 5-325 MG PO TABS
1.0000 | ORAL_TABLET | Freq: Once | ORAL | Status: AC
  Administered 2018-09-29: 1 via ORAL
  Filled 2018-09-29: qty 1

## 2018-09-29 MED ORDER — ORPHENADRINE CITRATE 30 MG/ML IJ SOLN: 60.0000 mg | Freq: Two times a day (BID) | INTRAMUSCULAR | Status: DC

## 2018-09-29 MED ORDER — METHOCARBAMOL 500 MG PO TABS: 500.0000 mg | ORAL_TABLET | Freq: Four times a day (QID) | ORAL | 0 refills | Status: AC

## 2018-09-29 NOTE — ED Notes (Signed)
Pt tearful vocalizing increased pain of a 10 on a 0-10 scale in her back. PA notified, See [facility]

## 2018-09-29 NOTE — ED Provider Notes (Signed)
[facility] Emergency Department Provider Note  ____________________________________________   First MD Initiated Contact with Patient 09/29/18 1324     (approximate)  I have reviewed the triage vital signs and the nursing notes.   HISTORY  Chief Complaint Back Pain    HPI Connie Green is a 33 y.o. female presents emergency department with C/o low back pain for 2 days, no known injury, pain is worse with movement, increased with bending over, denies numbness, tingling, or changes in bowel/urinary habits, pain does radiate to both hips and thighs.  She did not take any OTC meds.  She used ice and heat without any relief .  She denies abdominal pain, vaginal discharge, or UTI symptoms Remainder ros neg   Past Medical History:  Diagnosis Date  . Kidney stones     Patient Active Problem List   Diagnosis Date Noted  . Kidney stone 09/02/2013  . Left ureteral stone 09/02/2013    Past Surgical History:  Procedure Laterality Date  . BLADDER REPAIR W/ CESAREAN SECTION     x4  . CYSTOSCOPY/RETROGRADE/URETEROSCOPY Left 09/02/2013   Procedure: CYSTOSCOPY/RETROGRADE/LEFT URETEROSCOPY WITH BACKSTOP, BASKET EXTRACTION AND HOLMIUM LASER AND PlACEMENT OF LEFT STENT;  Surgeon: Kathi LudwigSigmund I Tannenbaum, MD;  Location: [facility];  Service: Urology;  Laterality: Left;  . LITHOTRIPSY    . WISDOM TOOTH EXTRACTION      Prior to Admission medications   Medication Sig Start Date End Date Taking? Authorizing Provider  acetaminophen (TYLENOL) 500 MG tablet Take 1,000 mg by mouth every 6 (six) hours as needed for moderate pain or headache.    [provider]  methocarbamol (ROBAXIN) 500 MG tablet Take 1 tablet (500 mg total) by mouth 4 (four) times daily. 09/29/18   Fisher, Roselyn BeringSusan W, PA-C  predniSONE (STERAPRED UNI-PAK 21 TAB) 10 MG (21) TBPK tablet Take 6 pills on day one then decrease by 1 pill each day 09/29/18   Faythe GheeFisher, Susan W, PA-C    Allergies Patient has  no known allergies.  No family history on file.  Social History Social History   Tobacco Use  . Smoking status: Current Every Day Smoker    Packs/day: 0.50    Types: Cigarettes  . Smokeless tobacco: Never Used  Substance Use Topics  . Alcohol use: No  . Drug use: No    Review of Systems  Constitutional: No fever/chills Eyes: No visual changes. ENT: No sore throat. Respiratory: Denies cough Genitourinary: Negative for dysuria. Musculoskeletal: Positive for back pain. Skin: Negative for rash.    ____________________________________________   PHYSICAL EXAM:  VITAL SIGNS: ED Triage Vitals  Enc Vitals Group     BP 09/29/18 1215 123/70     Pulse Rate 09/29/18 1215 98     Resp 09/29/18 1215 20     Temp 09/29/18 1215 99.4 F (37.4 C)     Temp Source 09/29/18 1215 Oral     SpO2 09/29/18 1215 98 %     Weight 09/29/18 1218 230 lb (104.3 kg)     Height 09/29/18 1218 5\' 7"  (1.702 m)     Head Circumference --      Peak Flow --      Pain Score 09/29/18 1218 10     Pain Loc --      Pain Edu? --      Excl. in GC? --     Constitutional: Alert and oriented. Well appearing and in no acute distress. Eyes: Conjunctivae are normal.  Head: Atraumatic.  Nose: No congestion/rhinnorhea. Mouth/Throat: Mucous membranes are moist.   Neck:  supple no lymphadenopathy noted Cardiovascular: Normal rate, regular rhythm. Heart sounds are normal Respiratory: Normal respiratory effort.  No retractions, lungs c t a  Abd: soft nontender bs normal all 4 quad GU: deferred Musculoskeletal: FROM all extremities, warm and well perfused.  Decreased rom of back due to discomfort, lumbar spine minimally tender, negative Slr, 5/5 strength in great toes b/l, 5/5 strength in lower legs, n/v intact, patient was able to get out of the wheelchair and walk to the bed with minimal difficulty Neurologic:  Normal speech and language.  Skin:  Skin is warm, dry and intact. No rash noted. Psychiatric: Mood and  affect are normal. Speech and behavior are normal.  ____________________________________________   LABS (all labs ordered are listed, but only abnormal results are displayed)  Labs Reviewed  POC URINE PREG, ED  POCT PREGNANCY, URINE   ____________________________________________   ____________________________________________  RADIOLOGY  X-ray of the lumbar spine is negative  ____________________________________________   PROCEDURES  Procedure(s) performed: Toradol 30 mg IM, Norflex 60 mg IM, Vicodin 1 p.o.  Procedures    ____________________________________________   INITIAL IMPRESSION / ASSESSMENT AND PLAN / ED COURSE  Pertinent labs & imaging results that were available during my care of the patient were reviewed by me and considered in my medical decision making (see chart for details).   Patient is 33 year old female presents emergency department complaints of low back pain for 2 days.  No OTC meds taken at home.  Patient is used ice and heat without any relief.  Pain radiates to the buttocks and thighs  Physical exam shows the patient is able to walk.  Lumbar spine is minimally tender.  Remainder the exam is basically unremarkable.  Toradol 30 mg IM, Norflex 60 mg IM X-ray of the lumbar spine   X-ray lumbar spine is negative.  Patient is requesting pain medication prior to discharge.  She is given 1 Vicodin p.o.  She is given prescription for Sterapred and Robaxin.  She is to follow-up with orthopedics.  Explained that due to the degenerative disc disease she could follow-up with the neurosurgeons or follow-up with regular orthopedics.  She is given both phone numbers.  She is discharged stable condition.  As part of my medical decision making, I reviewed the following data within the [place] notes reviewed and incorporated, Labs reviewed POC pregnancy negative, Old chart reviewed, Radiograph reviewed x-ray lumbar spine is negative,  Notes from prior ED visits and [place] Controlled Substance Database  ____________________________________________   FINAL CLINICAL IMPRESSION(S) / ED DIAGNOSES  Final diagnoses:  Acute right-sided low back pain with sciatica, sciatica laterality unspecified  Degenerative disc disease, lumbar      NEW MEDICATIONS STARTED DURING THIS VISIT:  Discharge Medication List as of 09/29/2018  4:46 PM    START taking these medications   Details  methocarbamol (ROBAXIN) 500 MG tablet Take 1 tablet (500 mg total) by mouth 4 (four) times daily., Starting Sat 09/29/2018, Normal    predniSONE (STERAPRED UNI-PAK 21 TAB) 10 MG (21) TBPK tablet Take 6 pills on day one then decrease by 1 pill each day, Normal         Note:  This document was prepared using Dragon voice recognition software and may include unintentional dictation errors.     Versie Starks, PA-C 09/29/18 1747    Lavonia Drafts, MD 09/30/18 [phone]

## 2018-09-29 NOTE — ED Triage Notes (Signed)
Low back pain radiating to both legs x 2 days.

## 2018-09-29 NOTE — Discharge Instructions (Addendum)
Follow-up with orthopedics.  Take the medication as prescribed.  Return emergency department worsening.

## 2019-07-05 IMAGING — CT CT ABD-PELV W/ CM
2 of 4 series · 16 of 46 positions shown, 18 images · IV contrast (APPLIED)
Comparison: CT of the abdomen and pelvis performed 08/31/2013

CLINICAL DATA: Acute onset of lower abdominal pain, nausea,
vomiting and diarrhea.

EXAM:
CT ABDOMEN AND PELVIS WITH CONTRAST
TECHNIQUE: Multidetector CT imaging of the abdomen and pelvis was performed
using the standard protocol following bolus administration of
intravenous contrast.
CONTRAST:  100mL OMNIPAQUE IOHEXOL 300 MG/ML SOLN

[Series 2: routine abd/pel with · axial · 0.98mm/px · z∈[-247,+173]mm · 13 of 92 slices shown, 15 images]
[im 4/92  soft-tissue]
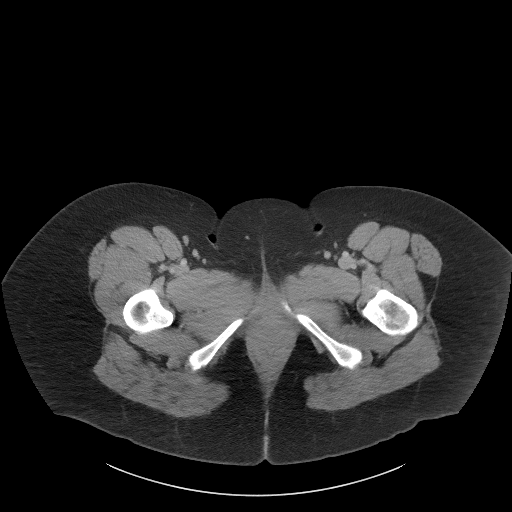
[im 4/92  bone]
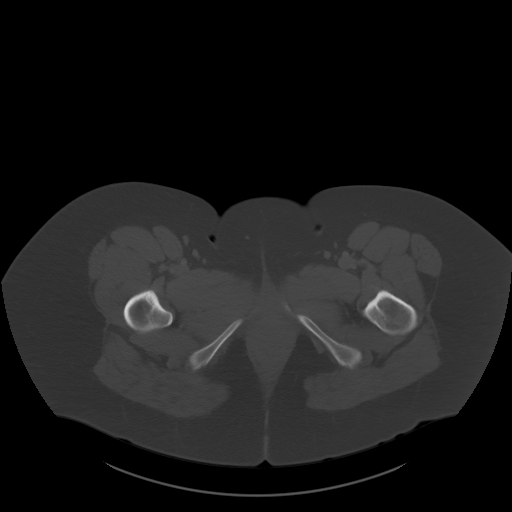
[im 12/92  soft-tissue]
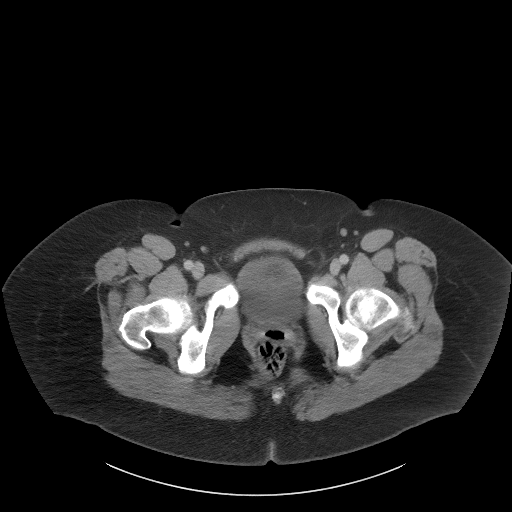
[im 19/92  soft-tissue]
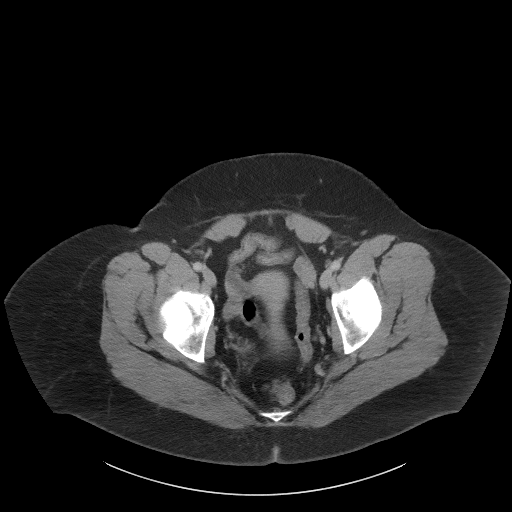
[im 27/92  soft-tissue]
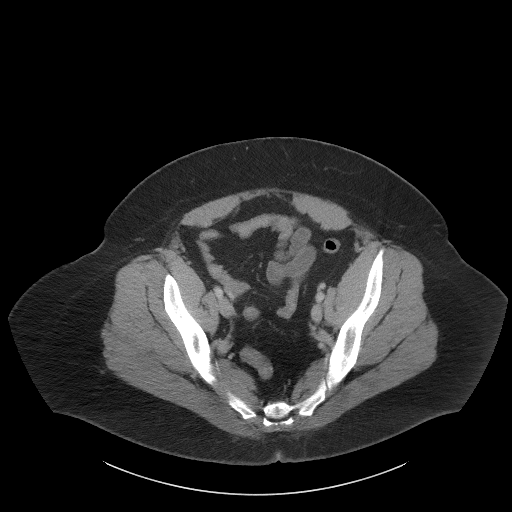
[im 31/92  soft-tissue]
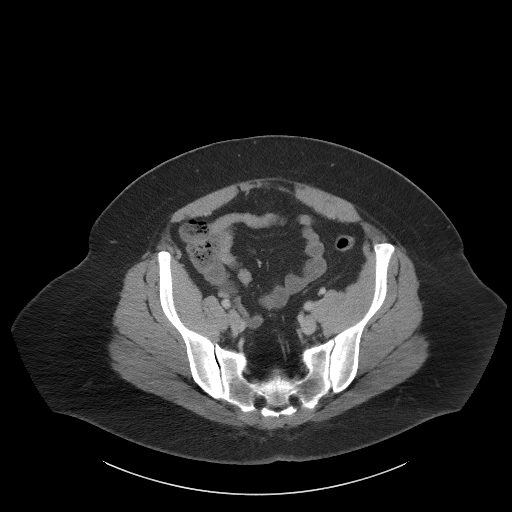
[im 38/92  soft-tissue]
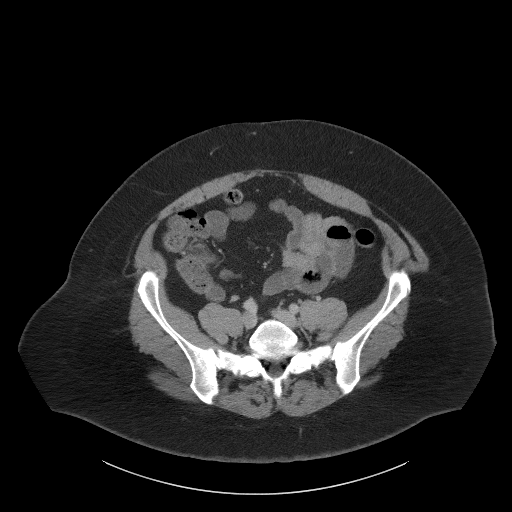
[im 46/92  soft-tissue]
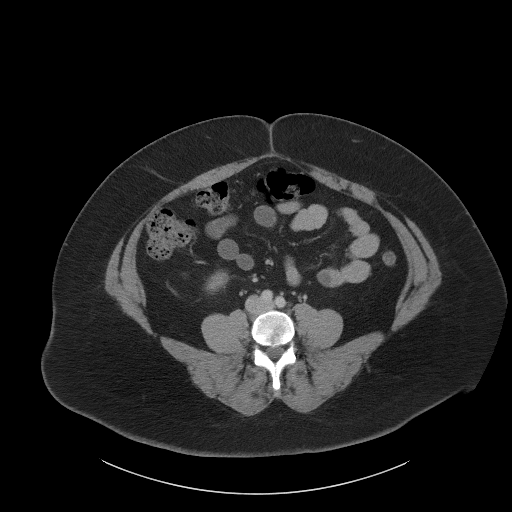
[im 54/92  soft-tissue]
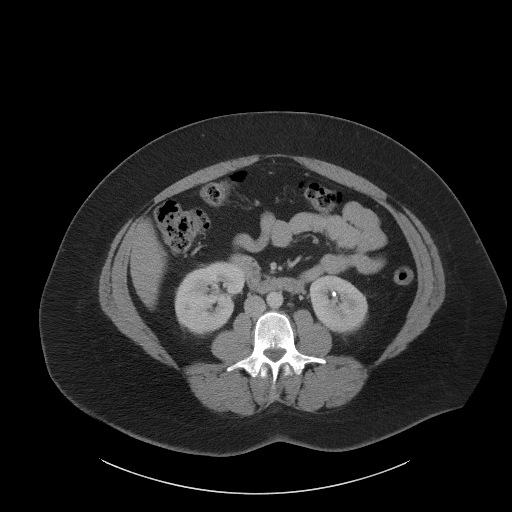
[im 61/92  soft-tissue]
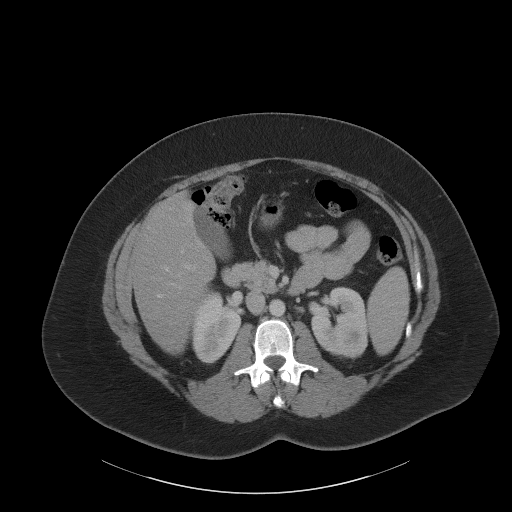
[im 61/92  bone]
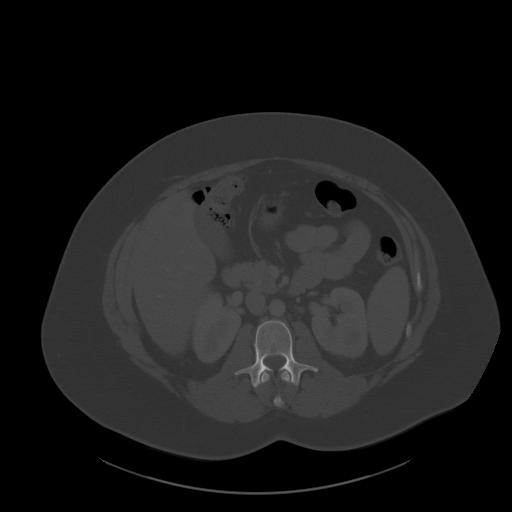
[im 65/92  soft-tissue]
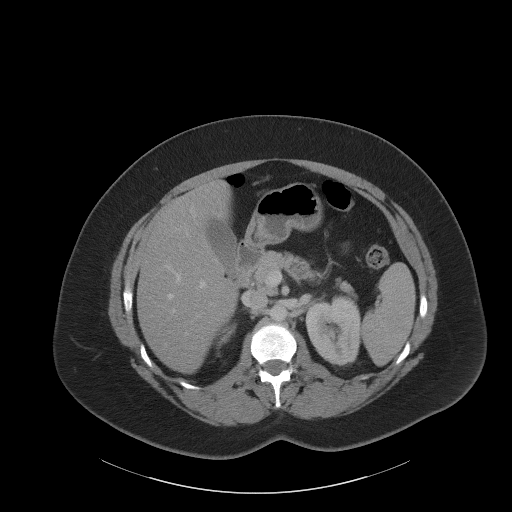
[im 73/92  soft-tissue]
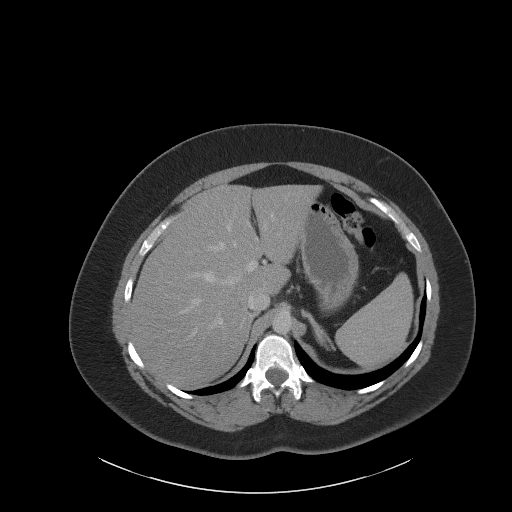
[im 80/92  soft-tissue]
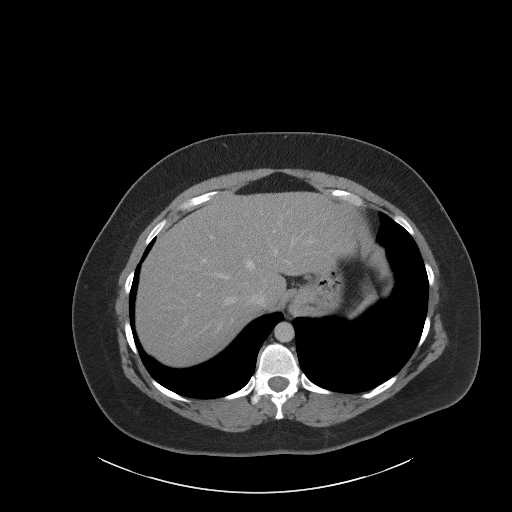
[im 88/92  soft-tissue]
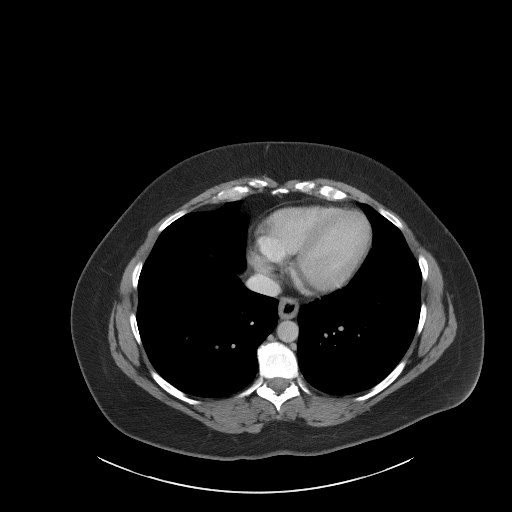

[Series 5: coronal st · coronal · 0.91mm/px · 3 of 109 slices shown]
[im 37/109  soft-tissue]
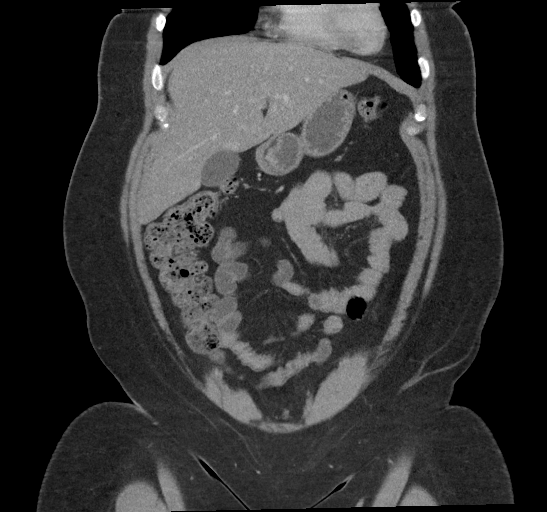
[im 49/109  soft-tissue]
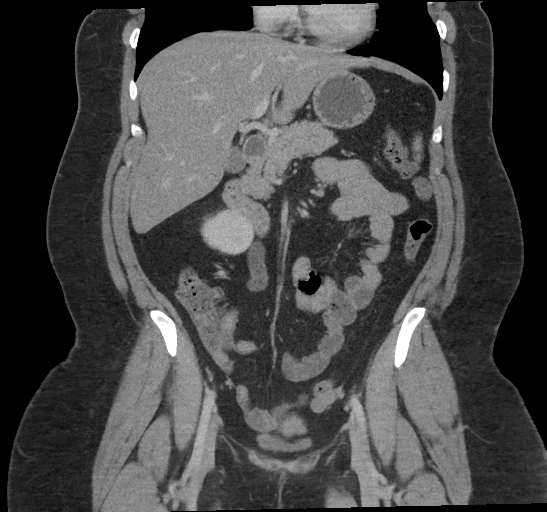
[im 61/109  soft-tissue]
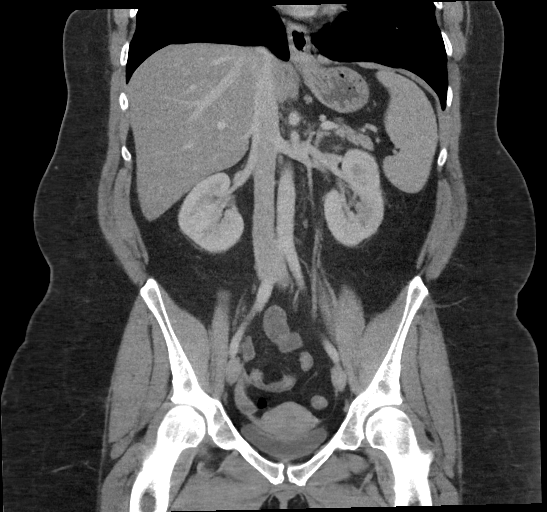

[16 of 46 positions shown; findings below may reference images not displayed]

FINDINGS: Lower chest: The visualized lung bases are grossly clear. The
visualized portions of the mediastinum are unremarkable.

Hepatobiliary: The liver is unremarkable in appearance. The
gallbladder is unremarkable in appearance. The common bile duct
remains normal in caliber.

Pancreas: The pancreas is within normal limits.

Spleen: The spleen is unremarkable in appearance.

Adrenals/Urinary Tract: The adrenal glands are unremarkable
appearance.

Nonobstructing left renal stones measure up to 4 mm in size. Mild
nonspecific perinephric stranding is noted bilaterally. There is no
evidence of hydronephrosis. No obstructing ureteral stones are seen.

Stomach/Bowel: The stomach is unremarkable in appearance. The small
bowel is within normal limits. The appendix is normal in caliber,
without evidence of appendicitis. The colon is unremarkable in
appearance.

Vascular/Lymphatic: The abdominal aorta is unremarkable in
appearance. The inferior vena cava is grossly unremarkable. No
retroperitoneal lymphadenopathy is seen. No pelvic sidewall
lymphadenopathy is identified.

Reproductive: The bladder is mildly distended and grossly
unremarkable. The uterus is unremarkable in appearance. The ovaries
are grossly symmetric. No suspicious adnexal masses are seen.

Other: No additional soft tissue abnormalities are seen.

Musculoskeletal: No acute osseous abnormalities are identified. The
visualized musculature is unremarkable in appearance.
IMPRESSION: 1. No acute abnormality seen to explain the patient's symptoms.
2. Nonobstructing left renal stones measure up to 4 mm in size.

## 2020-11-30 IMAGING — CR DG LUMBAR SPINE 2-3V
1 series · 3 of 3 positions shown · non-contrast
Comparison: None.

CLINICAL DATA: Lower back pain radiating down the legs.

EXAM:
LUMBAR SPINE - 2-3 VIEW

[Series 1: dg lumbar spine 2-3 views · 0.14mm/px · 3 of 3 slices shown]
[im 1/3]
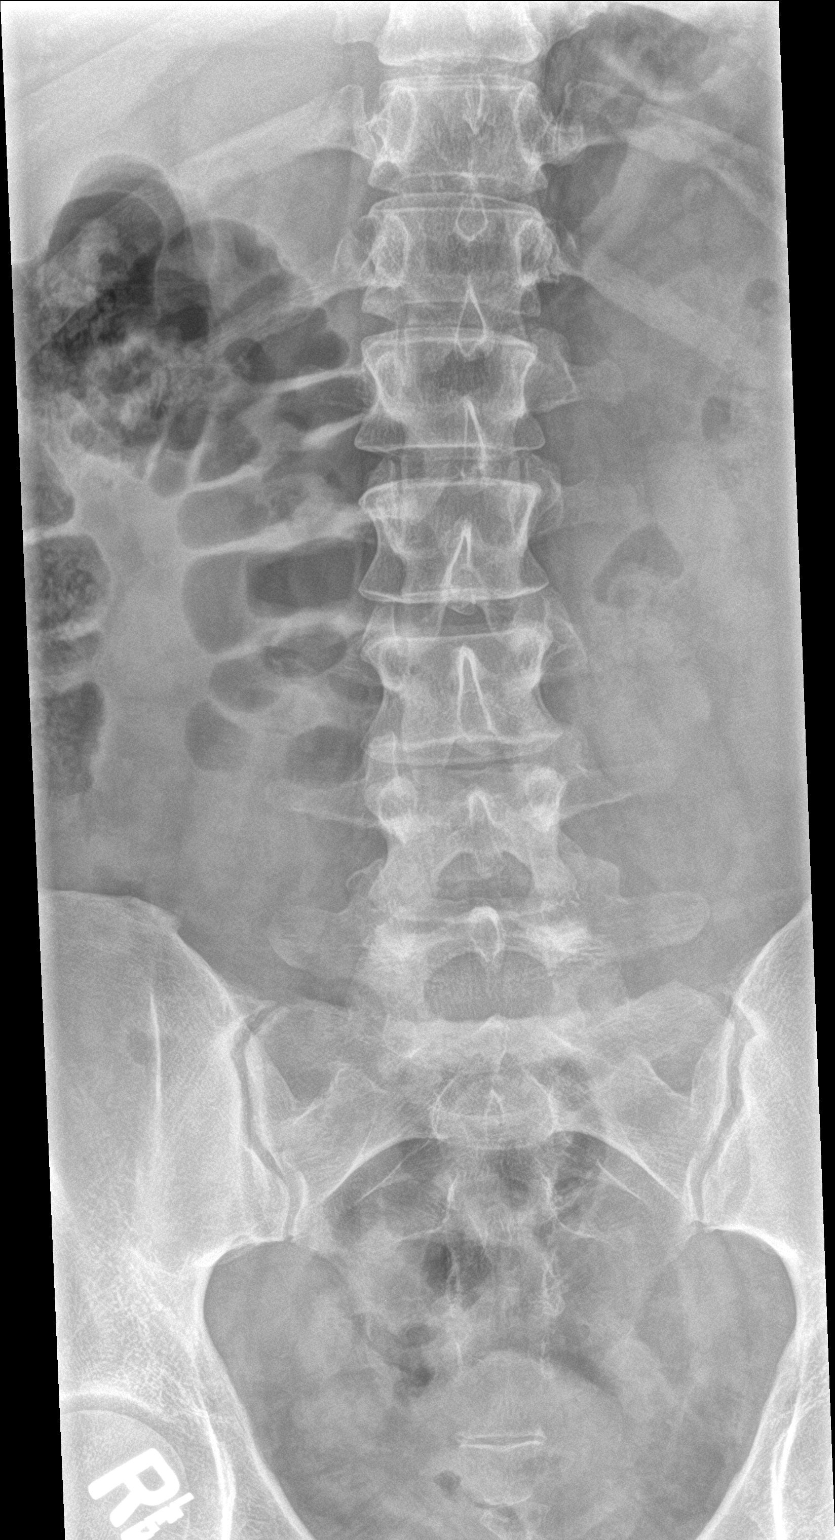
[im 2/3]
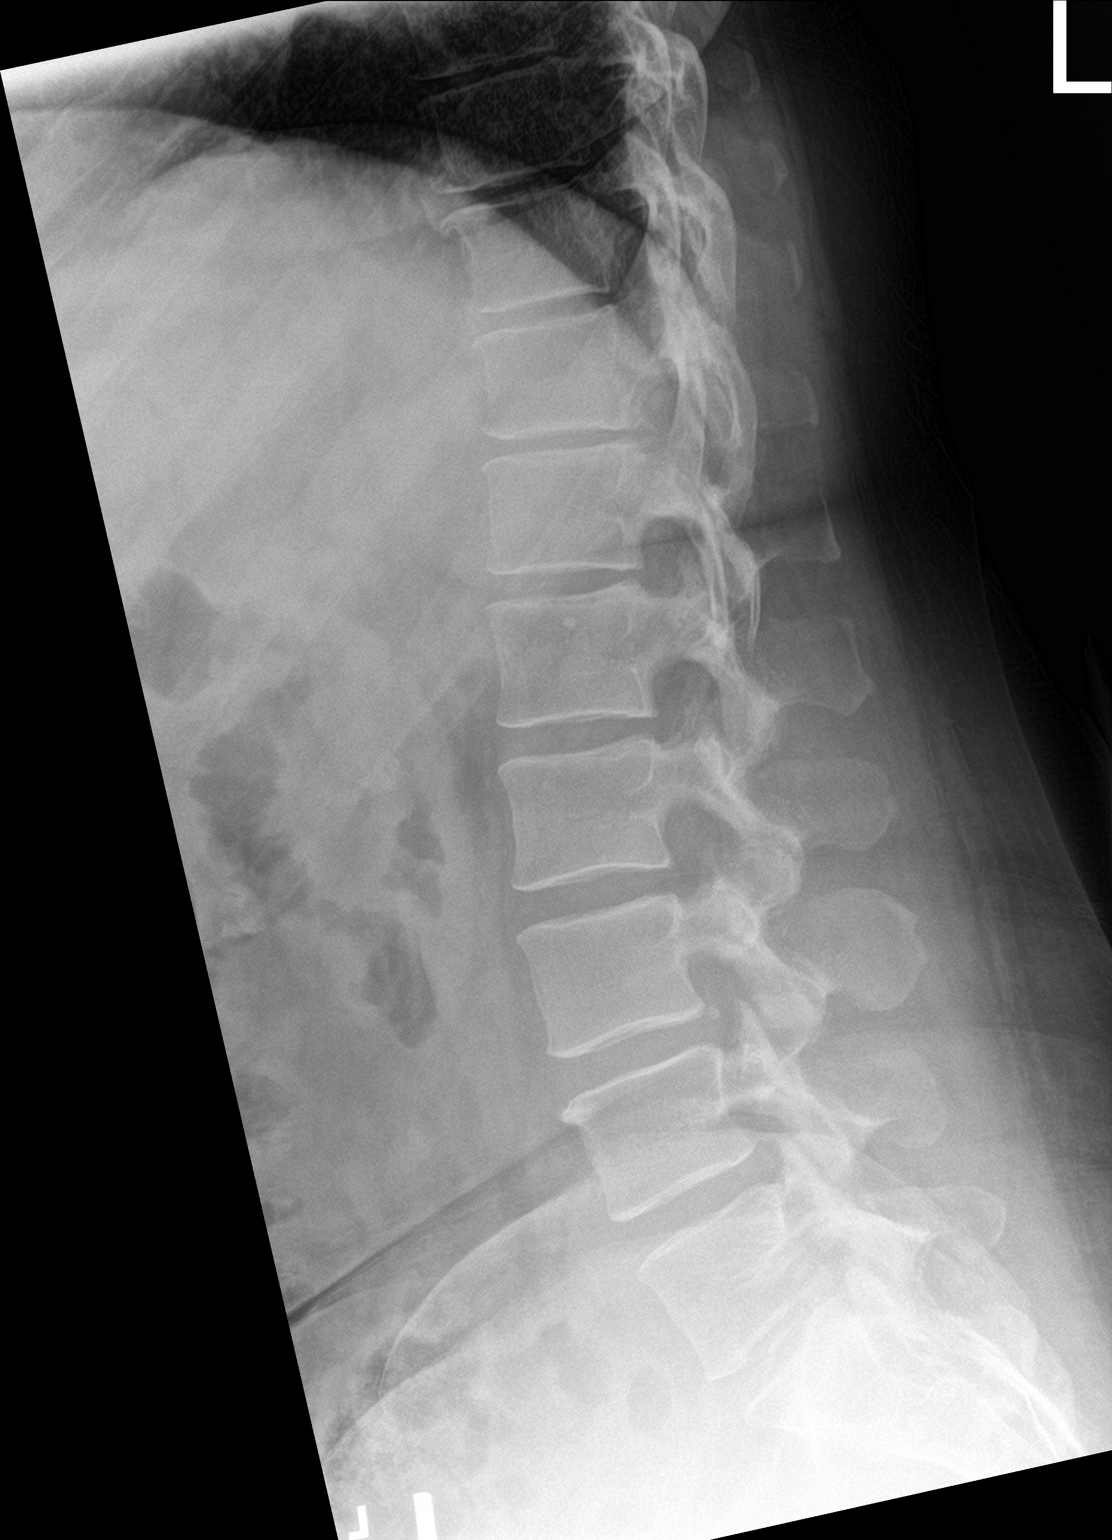
[im 3/3]
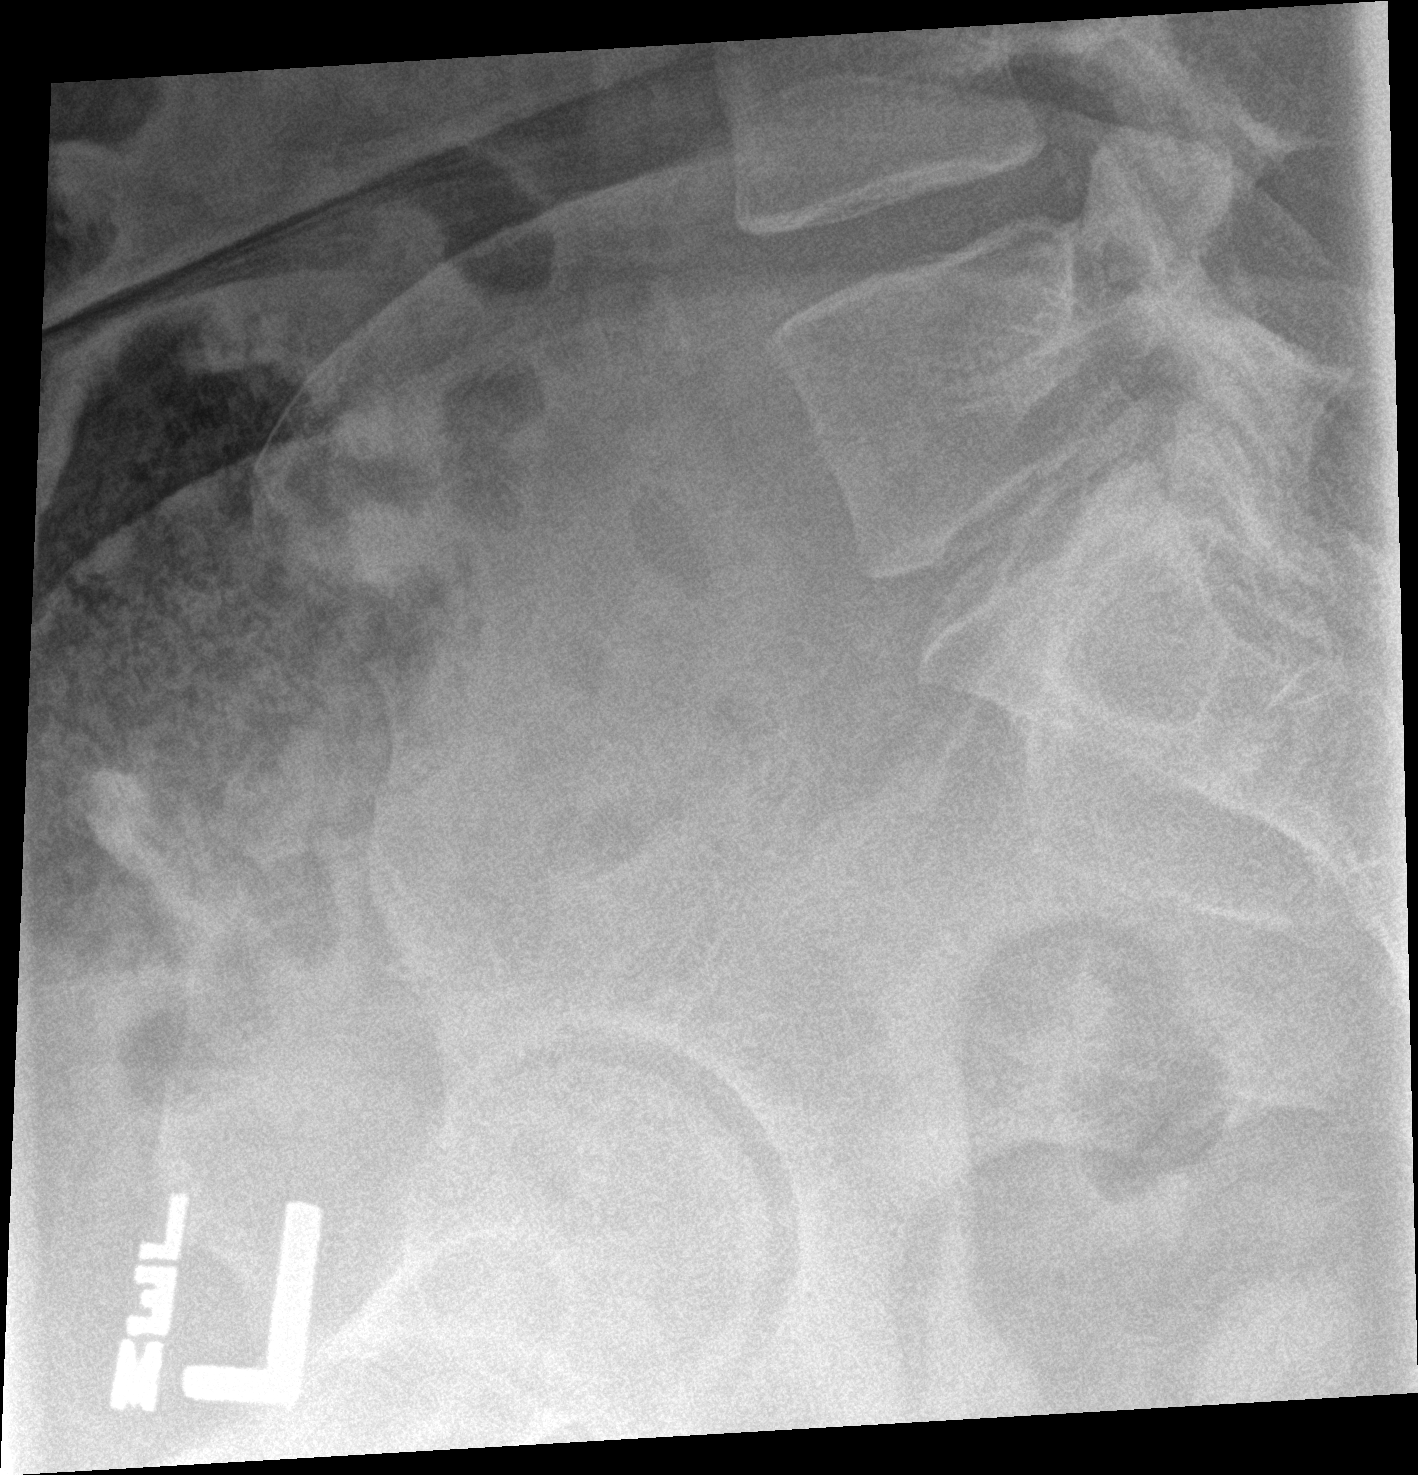

[3 of 3 positions shown; findings below may reference images not displayed]

FINDINGS: Normal anatomic alignment. No evidence for acute fracture or
dislocation. Preservation of the vertebral body and intervertebral
disc space heights. SI joints are unremarkable. L3-4 mild
degenerative disc disease.
IMPRESSION: Mild degenerative disc disease L3-4.

No acute osseous abnormality.
